# Patient Record
Sex: Female | Born: 1990 | Race: White | Hispanic: No
Health system: Southern US, Community
[De-identification: ages and names within clinical notes are randomized; demographics above are authoritative.]

## PROBLEM LIST (undated history)

## (undated) DIAGNOSIS — F419 Anxiety disorder, unspecified: Secondary | ICD-10-CM

## (undated) DIAGNOSIS — F329 Major depressive disorder, single episode, unspecified: Secondary | ICD-10-CM

## (undated) DIAGNOSIS — R51 Headache: Secondary | ICD-10-CM

## (undated) DIAGNOSIS — F1910 Other psychoactive substance abuse, uncomplicated: Secondary | ICD-10-CM

## (undated) DIAGNOSIS — F32A Depression, unspecified: Secondary | ICD-10-CM

## (undated) HISTORY — DX: Depression, unspecified: F32.A

## (undated) HISTORY — DX: Other psychoactive substance abuse, uncomplicated: F19.10

## (undated) HISTORY — DX: Anxiety disorder, unspecified: F41.9

## (undated) HISTORY — DX: Major depressive disorder, single episode, unspecified: F32.9

## (undated) HISTORY — DX: Headache: R51

---
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
## 2011-09-28 ENCOUNTER — Emergency Department (HOSPITAL_COMMUNITY)
Admission: EM | Admit: 2011-09-28 | Discharge: 2011-09-29 | Disposition: A | Discharge status: Other Acute Inpatient Hospital | Payer: 59 | Attending: Emergency Medicine | Admitting: Emergency Medicine

## 2011-09-28 DIAGNOSIS — Z79899 Other long term (current) drug therapy: Secondary | ICD-10-CM | POA: Insufficient documentation

## 2011-09-28 DIAGNOSIS — M2550 Pain in unspecified joint: Secondary | ICD-10-CM | POA: Insufficient documentation

## 2011-09-28 DIAGNOSIS — IMO0001 Reserved for inherently not codable concepts without codable children: Secondary | ICD-10-CM | POA: Insufficient documentation

## 2011-09-28 DIAGNOSIS — R109 Unspecified abdominal pain: Secondary | ICD-10-CM | POA: Insufficient documentation

## 2011-09-28 DIAGNOSIS — X58XXXA Exposure to other specified factors, initial encounter: Secondary | ICD-10-CM | POA: Insufficient documentation

## 2011-09-28 DIAGNOSIS — S51809A Unspecified open wound of unspecified forearm, initial encounter: Secondary | ICD-10-CM | POA: Insufficient documentation

## 2011-09-28 DIAGNOSIS — R110 Nausea: Secondary | ICD-10-CM | POA: Insufficient documentation

## 2011-09-28 DIAGNOSIS — F1110 Opioid abuse, uncomplicated: Secondary | ICD-10-CM | POA: Insufficient documentation

## 2011-09-28 DIAGNOSIS — R197 Diarrhea, unspecified: Secondary | ICD-10-CM | POA: Insufficient documentation

## 2011-09-28 LAB — CBC
MCH: 32.1 pg (ref 26.0–34.0)
MCHC: 34.1 g/dL (ref 30.0–36.0)
MCV: 94.1 fL (ref 78.0–100.0)
Platelets: 426 10*3/uL — ABNORMAL HIGH (ref 150–400)
RBC: 4.61 MIL/uL (ref 3.87–5.11)

## 2011-09-28 LAB — BASIC METABOLIC PANEL
BUN: 15 mg/dL (ref 6–23)
GFR calc non Af Amer: >90 mL/min (ref >90)
Glucose, Bld: 96 mg/dL (ref 70–99)
Potassium: 3.8 mEq/L (ref 3.5–5.1)

## 2011-09-28 LAB — DIFFERENTIAL
Eosinophils Absolute: 0 10*3/uL (ref 0.0–0.7)
Eosinophils Relative: 0 % (ref 0–5)
Lymphs Abs: 2.8 10*3/uL (ref 0.7–4.0)
Monocytes Absolute: 0.9 10*3/uL (ref 0.1–1.0)
Monocytes Relative: 9 % (ref 3–12)
Neutrophils Relative %: 64 % (ref 43–77)

## 2011-09-29 ENCOUNTER — Inpatient Hospital Stay (HOSPITAL_COMMUNITY)
Admission: RE | Admit: 2011-09-29 | Discharge: 2011-10-03 | DRG: 897 | Disposition: A | Discharge status: Home / Self Care | Payer: 59 | Source: Ambulatory Visit | Attending: Psychiatry | Admitting: Psychiatry

## 2011-09-29 DIAGNOSIS — F1120 Opioid dependence, uncomplicated: Principal | ICD-10-CM

## 2011-09-29 DIAGNOSIS — X838XXA Intentional self-harm by other specified means, initial encounter: Secondary | ICD-10-CM

## 2011-09-29 DIAGNOSIS — G8929 Other chronic pain: Secondary | ICD-10-CM

## 2011-09-29 DIAGNOSIS — F1910 Other psychoactive substance abuse, uncomplicated: Secondary | ICD-10-CM

## 2011-09-29 DIAGNOSIS — Z6379 Other stressful life events affecting family and household: Secondary | ICD-10-CM

## 2011-09-29 DIAGNOSIS — S51809A Unspecified open wound of unspecified forearm, initial encounter: Secondary | ICD-10-CM

## 2011-09-29 DIAGNOSIS — F1520 Other stimulant dependence, uncomplicated: Secondary | ICD-10-CM

## 2011-09-29 DIAGNOSIS — Z560 Unemployment, unspecified: Secondary | ICD-10-CM

## 2011-09-29 DIAGNOSIS — F39 Unspecified mood [affective] disorder: Secondary | ICD-10-CM

## 2011-09-29 LAB — RAPID URINE DRUG SCREEN, HOSP PERFORMED
Amphetamines: NOT DETECTED
Barbiturates: NOT DETECTED
Benzodiazepines: POSITIVE — AB

## 2011-10-05 ENCOUNTER — Other Ambulatory Visit (HOSPITAL_COMMUNITY): Payer: 59 | Attending: Physician Assistant

## 2011-10-05 DIAGNOSIS — F1910 Other psychoactive substance abuse, uncomplicated: Secondary | ICD-10-CM | POA: Insufficient documentation

## 2011-10-05 DIAGNOSIS — F39 Unspecified mood [affective] disorder: Secondary | ICD-10-CM | POA: Insufficient documentation

## 2011-10-05 DIAGNOSIS — F1120 Opioid dependence, uncomplicated: Secondary | ICD-10-CM | POA: Insufficient documentation
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
## 2011-10-07 ENCOUNTER — Other Ambulatory Visit (HOSPITAL_COMMUNITY): Payer: 59 | Attending: Physician Assistant

## 2011-10-07 DIAGNOSIS — F39 Unspecified mood [affective] disorder: Secondary | ICD-10-CM | POA: Insufficient documentation

## 2011-10-07 DIAGNOSIS — F1120 Opioid dependence, uncomplicated: Secondary | ICD-10-CM | POA: Insufficient documentation

## 2011-10-07 DIAGNOSIS — F1910 Other psychoactive substance abuse, uncomplicated: Secondary | ICD-10-CM | POA: Insufficient documentation

## 2011-10-10 ENCOUNTER — Encounter (HOSPITAL_COMMUNITY): Payer: Self-pay | Admitting: *Deleted

## 2011-10-10 ENCOUNTER — Other Ambulatory Visit (HOSPITAL_COMMUNITY): Payer: 59 | Admitting: Psychology
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
## 2011-10-12 ENCOUNTER — Encounter (HOSPITAL_COMMUNITY): Payer: Self-pay | Admitting: Psychology

## 2011-10-12 ENCOUNTER — Other Ambulatory Visit (HOSPITAL_COMMUNITY): Payer: 59 | Admitting: Psychology

## 2011-10-13 ENCOUNTER — Other Ambulatory Visit (HOSPITAL_COMMUNITY): Payer: Self-pay | Admitting: *Deleted

## 2011-10-13 DIAGNOSIS — F1920 Other psychoactive substance dependence, uncomplicated: Secondary | ICD-10-CM

## 2011-10-13 NOTE — Progress Notes (Signed)
  The patient did not phone me or call to explain her absence today in the CD-IOP. When I noted her absence and asked the group about her, one member stated she had sent him a text him informing him that she would not be in group today. She had asked that he tell me she wouldn't be in group. The patient has been instructed to contact myself or HB directly to communicate and issues or problems she may be having. This absence will not be excused and I will speak with her when she is in group next to discuss this.

## 2011-10-14 ENCOUNTER — Other Ambulatory Visit (HOSPITAL_COMMUNITY): Payer: 59

## 2011-10-17 ENCOUNTER — Other Ambulatory Visit (HOSPITAL_COMMUNITY): Payer: 59

## 2011-10-17 ENCOUNTER — Telehealth (HOSPITAL_COMMUNITY): Payer: Self-pay | Admitting: *Deleted

## 2011-10-19 ENCOUNTER — Other Ambulatory Visit (HOSPITAL_COMMUNITY): Payer: 59 | Admitting: Psychology

## 2011-10-20 NOTE — Progress Notes (Signed)
    Daily Group Progress Note  Program: CD-IOP   Group Time: 1-2:30 pm  Participation Level: None  Behavioral Response: Drowsy, nodding off  Type of Therapy: Psycho-education Group  Topic:  Post Acute Withdrawal Symptoms. A presentation and handout was provided on PAWS. These are the very common and frequently experienced symptoms in early recovery. They include memory loss, irritability, insomnia, clumsiness and a number of other impairments that are typically experienced in early recovery. The importance of being patient with one's self and realizing that there is nothing wrong is very critical. Members shared their own particular experiences with PAWS and there was not one group member who had not experienced at least 1 of these symptoms.       Group Time: 2:45-4 pm  Participation Level: None  Behavioral Response: patient did not engage in the discussion or share about her current circumstances. she kept her head down and was writing on her paper  Type of Therapy: Process Group  Topic: Group process and graduation. The second half of group was spent in process. Members shared feelings about current struggles and issues they are facing in early recovery. As the session neared the end, a graduation ceremony was held for a member who was successfully completing the program.    Summary: The patient did not engage in the session today. When asked whether she had attended an AA meeting yesterday as she had been instructed, she reported she had not gone to any meetings. She has continued to avoid eye contact and was observed "nodding off" on numerous occasions throughout the session. She looks down most of the time and appears to be taking notes. There is not interaction between herself and other group members and her reasons for being in the program are unclear.    Family Program: Family present? No   Name of family member(s):   UDS collected: No Results:  AA/NA attended?: No  Sponsor?: No   Vamsi Apfel, LCAS

## 2011-10-21 ENCOUNTER — Other Ambulatory Visit (HOSPITAL_COMMUNITY): Payer: 59 | Admitting: Psychology

## 2011-10-24 ENCOUNTER — Other Ambulatory Visit (HOSPITAL_COMMUNITY): Payer: 59 | Admitting: Psychology

## 2011-10-24 NOTE — Progress Notes (Signed)
    Daily Group Progress Note  Program: CD-IOP   Group Time: 1-2:30 pm  Participation Level: Minimal  Behavioral Response: Appropriate  Type of Therapy: Activity Group  Topic: Gentle Yoga: A certified yoga instructor visited the group this afternoon. She led the group om yoga stretches and poses. The group responded well and concurred that they felt relaxed and more open at the conclusion of the session. The purpose of this session was to demonstrate the way one can change their feelings quickly and in a healthy manner. There was good feedback and the group responded well to this intervention.       Group Time: 2:45-4 pm  Participation Level: None  Behavioral Response: Patient only spoke when I addressed or questioned her.  Type of Therapy: Process Group  Topic: Process Group; second half of session spent in process. Members shared their feelings about current issues in early recovery. They discussed their plans for the upcoming weekend and the activities that would support their efforts at abstinence.    Summary: The patient engaged actively in the yoga class and appeared much brighter and more awake than she has in previous sessions. [name] did not offer any commentary about herself or her life and I pointed out that she needed to begin sharing in group. When asked about attending AA, the patient admitted she had not been to any meetings. She explained that she was scared. Another group member said she would meet her at a meeting and they could go together. I encouraged them to speak more about this at the end of the group. This patient has been quiet since she first came to the program and it is unclear what she intends to accomplish by sitting and listening for the next 6-7 weeks. Will speak with her again about opening up in group.   Family Program: Family present? No   Name of family member(s):   UDS collected: No Results:  AA/NA attended?: No}  Sponsor?:  No   [name], LCAS

## 2011-10-25 NOTE — Progress Notes (Signed)
    Daily Group Progress Note  Program: CD-IOP   Group Time: 1-2:30 pm  Participation Level: None  Behavioral Response: patient does not talk in the session.  Type of Therapy: Psycho-education Group  Topic: Values: A presentation was made on Values and the way one's addiction "hijacks" one's values. Group members were provided with a handout listing many different values. They were asked to identify their top 3. Members then shared some of the values they had identified. A discussion ensued about the ways in which members abandoned their values while in their active addiction. There was good disclosure and members were very candid about the things they had done in their active addiction that conflicted entirely with what they value.      Group Time: 2:45-4pm  Participation Level: Minimal  Behavioral Response: Rationalizing  Type of Therapy: Process Group  Topic: Process Group; second half of session spent in process. Group members shared feelings in early recovery. Some complained about emotional lability while another expressed frustration over lack of sleep. One member brought his S/O and she shared about how his drinking had impacted her.    Summary: the patient said little during discussion on Values. When asked about attending AA over the weekend, she shook her head and admitted she had not gone to any meetings. She reported her mother was going to go with her to a meeting tonight. The patient's fellow group members encouraged her to attend a meeting and described their hesitation upon their first meeting. The patient stated her intention to go to a meeting tonight.    Family Program: Family present? No   Name of family member(s):   UDS collected: No Results:   AA/NA attended?: No  Sponsor?: No   Connar Keating, LCAS

## 2011-10-26 ENCOUNTER — Encounter (HOSPITAL_COMMUNITY): Payer: Self-pay | Admitting: *Deleted

## 2011-10-26 ENCOUNTER — Other Ambulatory Visit (HOSPITAL_COMMUNITY): Payer: 59 | Admitting: Psychology

## 2011-10-26 NOTE — Progress Notes (Addendum)
   THERAPIST PROGRESS NOTE Session Time: 2:00 pm  Participation Level: Active  Behavioral Response: Fairly GroomedAlertDepressed  Type of Therapy: Individual Therapy  Treatment Goals addressed: Anxiety, Communication: pt encouraged to participate more during group time and communicate with group members, Coping and Diagnosis: worked with pt on accepting her chemical dependence diagnosis and beginning to form relapse prevention plan  Interventions: CBT, Solution Focused, Strength-based, Conservator, museum/gallery, Supportive and Social Skills Training  Summary: [name] is a 20 y.o. female who presents with a flat affect and very reserved manner. She admits to isolating a lot and being very shy in the group setting. She has not attended any AA/NA meetings since starting the CDIOP group. Challenged her to do so and reminded her that she must in order to stay in the program. Pt discussed her daughter [name]) who lives with pts grandmother in [place]. Pt reports missing her but wanting her to stay in [place] because she is happy in school and she is well provided for. Pt had a difficult time naming negative consequences of her addiction, but when challenged she was able to name withdrawing from family and friends, losing relationships which were important to her and reduced academic performance. She has limited insight and is very emotionally and socially immature but she appears invested in staying sober. She reported that she "cannot go back to living like that" and explained it was very tiring always using or wanting to use.  Suicidal/Homicidal: Nowithout intent/plan  Therapist Response: Challenged pt to begin attending 12-step groups 4 times/week and helped her identify groups that would be a good fit for her. Challenged pt to begin to have a routine in her days and change some of her behaviors to begin to feel better (excercise, eating better, not isolating so much). Validated pts introversion  but encouraged her to begin to share in group and participate. Identified strengths for pt that she has shown- resilience, determination to get clean, and high level of care for others. Completed Treatment Plan with pt and sent it in for batch scan.  Plan: Return again in 2 weeks.  Diagnosis: Axis I: Opiate dependence (304.00)    Axis II: No diagnosis    Aris Lot, COUNS 10/26/2011

## 2011-10-28 ENCOUNTER — Other Ambulatory Visit (HOSPITAL_COMMUNITY): Payer: 59

## 2011-10-31 ENCOUNTER — Other Ambulatory Visit (HOSPITAL_COMMUNITY): Payer: 59 | Admitting: Psychology

## 2011-10-31 NOTE — Progress Notes (Signed)
    Daily Group Progress Note  Program: CD-IOP   Group Time: 1-2:30 pm  Participation Level: none  Behavioral Response: patient did not share during this half of group  Type of Therapy: Psycho-education Group  Topic: Communication: A presentation was provided on different types of communication styles. A handout was provided with examples of assertive, passive, aggressive and passive-aggressive communication styles. A scenario was read and members acted out 3 different role plays demonstrating the different ways they could communicate. There was good discussion and members identified their typical communication styles, most of which were passive and passive-aggressive. There was good discussion with examples provided and the group reflecting back on their perspectives and experiences.      Group Time: 2:45-4pm  Participation Level: Minimal  Behavioral Response: Sharing  Type of Therapy: Process Group  Topic: Process group: second half of group was spent in process. Members discussed their feelings and frustrations in early recovery. There was also discussion about plans for the upcoming holiday and preparations that will support ongoing recovery.    Summary: The patient was quiet for much of the group. In the process session, she reported she has a friend who wants her to come down and attend a meeting with her. That, or get high with her. The group expressed concerns about their fellow group member and encouraged her to focus on herself. One member reminded her that she has to be selfish before she can be selfless. She admitted that traveling to [place], where she grew up and used a lot of drugs, makes her nervous, but she was planning on spending the holiday with her grandmother and daughter. She reported she had attended an NA meeting last night here in [place]. It had been a speaker meeting and there had been a lot of people. She assured the group she would attend a meeting over  the weekend.    Family Program: Family present? No   Name of family member(s):   UDS collected: No Results:   AA/NA attended?: [place]  Sponsor?: No   Denelda Akerley, LCAS

## 2011-11-01 NOTE — Progress Notes (Signed)
    Daily Group Progress Note  Program: CD-IOP   Group Time: 1-2:30 pm  Participation Level: Minimal  Behavioral Response: Sharing  Type of Therapy: Process Group  Topic: Group Process; first half of group was spent in process. Members shared about their holiday weekends and the numerous ways they spent their long holiday weekend. Three members admitted using over the weekend and their "slips" were processed and discussed at length. The contrast in motivation and intention was quite apparent among the group members and while some members attended many meetings over the weekend, others did not go to any meetings or seek out support for their recovery.      Group Time: 2:45-4 pm  Participation Level: None  Behavioral Response: patient did not share or comment during film  Type of Therapy: Psycho-education Group  Topic: The Neurobiology of Addiction, Part 1. The second half of group was spent watching the DVD, "The Neurobiology of Addiction". It presented a very informative presentation on the brain chemistry of addiction and the chemicals that chemically dependent people are missing or experience reduced levels of. The film was halted numerous times during the presentation and the concepts discussed thoroughly to insure that group members clearly understood the chemical nature of their addiction   Summary: The patient admitted during the check-in that her sobriety date had changed. She admitted she had shot heroin over the weekend and had even done some powder around 6 am this morning. When asked, she denied that she was under the influence. The patient reported that she had gone to [place] for Thanksgiving, but on Friday her mother stayed there while she returned to [place]. She admitted that she is now shooting heroin herself - last time she had informed the group that someone had injected the drug for her. The patient stated her intention to attend a meeting today and another member  encouraged her to come with them to an AA meeting. The patient displayed poor insight and couldn't say what she was thinking or intended to do going forward.   Family Program: Family present? No   Name of family member(s):   UDS collected: No Results:   AA/NA attended?: No  Sponsor?: No   Laneisha Mino, LCAS

## 2011-11-02 ENCOUNTER — Other Ambulatory Visit (HOSPITAL_COMMUNITY): Payer: 59 | Admitting: Psychology

## 2011-11-02 ENCOUNTER — Encounter (HOSPITAL_COMMUNITY): Payer: Self-pay | Admitting: Psychology

## 2011-11-02 DIAGNOSIS — F1920 Other psychoactive substance dependence, uncomplicated: Secondary | ICD-10-CM

## 2011-11-03 ENCOUNTER — Telehealth (HOSPITAL_COMMUNITY): Payer: Self-pay | Admitting: *Deleted

## 2011-11-03 ENCOUNTER — Encounter (HOSPITAL_COMMUNITY): Payer: Self-pay | Admitting: Psychology

## 2011-11-03 NOTE — Progress Notes (Signed)
    Daily Group Progress Note  Program: CD-IOP   Group Time: 1-2:30 pm  Participation Level: Minimal  Behavioral Response: patient made no comments during this half of group  Type of Therapy: Psycho-education Group  Topic: Pharmacist: The first half of group was spent with the [facility] Pharmacist, Peggye Fothergill. She engaged the group in a discussion on different medications for various illnesses and the specific issues and complications most typical in the chemically dependent population. Group members asked many questions, especially as relates to mood disorders and sleeplessness. There was good interaction and exchange and the session went well.      Group Time: 2:45-4 pm  Participation Level: Active  Behavioral Response: Sharing, passive, appears helpless  Type of Therapy: Process Group  Topic:Group Process and Graduation: second half of group spent in process. There were 2 new group members present and they shared about their lives in addiction and what they are wanting from the group. Near the conclusion of the session, a graduation ceremony was held for a graduating member.     Summary: The patient was very quiet during this half of group. I sought her out and asked about her recovery? She admitted she has continued to use heroin on a daily basis. She has learned to inject herself and also noted she is "shooting" cocaine, but not with the heroin. When asked about whether she is feeling ill and suffering withdrawal, she admitted she doesn't feel good in the mornings and has purchased Soboxone on the street at least once to help with the withdrawal. Other group members expressed concerns about her and encouraged her to consider doing something more to address her addiction. The patient reminded them that the people she met upstairs in detox are the people she has associated with since discharge and they have taught her to use heroin. Other members explained that she could go somewhere for  28 days and get some sobriety, which she doesn't appear to be doing in this program. The patient agreed that it was a problem, but shrugged her shoulders and appeared almost unable to doing anything about it. The patient admitted she had not gone to any meetings, but agreed it would help if she would go. Another member invited her to join her at a 6 pm meeting. The patient continues to use and she will be recommended to consider a residential referral because of her inability to stop using.    Family Program: Family present? No   Name of family member(s):   UDS collected: Yes Results:   AA/NA attended?: No  Sponsor?: No   Faustino Luecke, LCAS

## 2011-11-04 ENCOUNTER — Other Ambulatory Visit (HOSPITAL_COMMUNITY): Payer: 59 | Admitting: *Deleted

## 2011-11-04 NOTE — Progress Notes (Signed)
Patient ID: [name], female   DOB: 01/22/1991, 20 y.o.   MRN: [id number] I spoke with the patient at the conclusion of her CD-IOP group session. The patient had disclosed in group that she has continued to use heroin daily and has felt very badly in the mornings because of withdrawal. She admitted she had even purchased suboxone to feel better and ward off the withdrawal symptoms. I explained that it seemed clear that she could not attain any degree of sobriety and stability on her own and encouraged her to consider inpatient treatment. The patient was very passive and uncertain about her intentions. She seems unable to articulate what she needs or is even willing to do and expressed that she was "scared" to go into residential treatment. I validated her fear of the unknown, but pointed out that her fellow group members spoke highly about their own experiences in residential treatment and how helpful the experience had proven for their recovery. I stated that I would discuss her case with the treatment team tomorrow morning and encouraged her to consider this recommendation. I assured the patient that I would speak with her before I contacted her mother, but that we would very likely stick to a recommendation for detox and residential treatment to insure her safety and well-being. The patient agreed to speak with me tomorrow.

## 2011-11-04 NOTE — Progress Notes (Signed)
Patient ID: [name], female   DOB: 07/22/1991, 20 y.o.   MRN: [id number] Discussed this patient's condition in treatment team meeting and the agreement was that the patient should be referred to residential treatment. I phoned the patient to discuss this with her after she had spoken with her primary counselor, HB. I encouraged her to contact [facility] and speak with them about what is going on with her and what they are able to offer her. I reminded the patient that if she was not pleased with her treatment she was always free to leave. I explained that she had developed a "habit' and most opiate dependent people were unsuccessful in "freeing" themselves from opiate dependence without outside help. The patient agreed with me that if she continues it is likely to get much worse. I asked her to consider inpatient in order to attain total sobriety and that she could return to the CD-IOP upon discharge. She stated she would contact the facility with the number I had provided her.

## 2011-11-07 ENCOUNTER — Other Ambulatory Visit (HOSPITAL_COMMUNITY): Payer: 59 | Attending: Physician Assistant

## 2011-11-07 NOTE — Progress Notes (Signed)
Patient ID: [name], female   DOB: Aug 23, 1991, 20 y.o.   MRN: [id number] Discharge Note: Pt is being discharged from the CDIOP program for noncompliance. She reports that she has been using IV heroin and cocaine daily for the past two weeks. She refuses to go to residential treatment at [facility] and has been told that IOP is no longer the right level of care for her. Her prognosis is poor unless she decides to pursue care for her addiction. She has attended one AA/NA meeting in while in our program and has refused any other outside 12-step work as well. Her mother has been contacted regarding the risk that we feel pt is at for overdose. Pt was also informed that she is welcome to return to our IOP program once she completes a higher level of care. Every attempt was made to help patient see her need for residential treatment and provide her with the means/process to receive it.

## 2011-11-09 ENCOUNTER — Other Ambulatory Visit (HOSPITAL_COMMUNITY): Payer: 59

## 2011-11-11 ENCOUNTER — Other Ambulatory Visit (HOSPITAL_COMMUNITY): Payer: 59

## 2011-11-14 ENCOUNTER — Other Ambulatory Visit (HOSPITAL_COMMUNITY): Payer: 59

## 2011-11-16 ENCOUNTER — Other Ambulatory Visit (HOSPITAL_COMMUNITY): Payer: 59

## 2011-11-18 ENCOUNTER — Other Ambulatory Visit (HOSPITAL_COMMUNITY): Payer: 59

## 2011-11-21 ENCOUNTER — Other Ambulatory Visit (HOSPITAL_COMMUNITY): Payer: 59

## 2011-11-23 ENCOUNTER — Other Ambulatory Visit (HOSPITAL_COMMUNITY): Payer: 59

## 2011-11-25 ENCOUNTER — Other Ambulatory Visit (HOSPITAL_COMMUNITY): Payer: 59

## 2011-11-28 ENCOUNTER — Other Ambulatory Visit (HOSPITAL_COMMUNITY): Payer: 59

## 2011-11-30 ENCOUNTER — Other Ambulatory Visit (HOSPITAL_COMMUNITY): Payer: 59

## 2011-12-02 ENCOUNTER — Ambulatory Visit (INDEPENDENT_AMBULATORY_CARE_PROVIDER_SITE_OTHER): Payer: 59

## 2011-12-02 ENCOUNTER — Other Ambulatory Visit (HOSPITAL_COMMUNITY): Payer: 59

## 2011-12-02 DIAGNOSIS — Z0000 Encounter for general adult medical examination without abnormal findings: Secondary | ICD-10-CM

## 2011-12-02 DIAGNOSIS — K920 Hematemesis: Secondary | ICD-10-CM

## 2011-12-02 DIAGNOSIS — Z113 Encounter for screening for infections with a predominantly sexual mode of transmission: Secondary | ICD-10-CM

## 2011-12-05 ENCOUNTER — Other Ambulatory Visit (HOSPITAL_COMMUNITY): Payer: 59

## 2011-12-07 ENCOUNTER — Other Ambulatory Visit (HOSPITAL_COMMUNITY): Payer: 59 | Attending: Physician Assistant

## 2011-12-08 ENCOUNTER — Emergency Department (HOSPITAL_COMMUNITY)
Admission: EM | Admit: 2011-12-08 | Discharge: 2011-12-09 | Disposition: A | Discharge status: Other Acute Inpatient Hospital | Payer: 59 | Attending: Emergency Medicine | Admitting: Emergency Medicine

## 2011-12-08 ENCOUNTER — Encounter (HOSPITAL_COMMUNITY): Payer: Self-pay | Admitting: Emergency Medicine

## 2011-12-08 DIAGNOSIS — F1920 Other psychoactive substance dependence, uncomplicated: Secondary | ICD-10-CM

## 2011-12-08 DIAGNOSIS — F341 Dysthymic disorder: Secondary | ICD-10-CM | POA: Insufficient documentation

## 2011-12-08 LAB — CBC
HCT: 42.3 % (ref 36.0–46.0)
Hemoglobin: 13.7 g/dL (ref 12.0–15.0)
MCH: 30 pg (ref 26.0–34.0)
MCV: 92.6 fL (ref 78.0–100.0)
RBC: 4.57 MIL/uL (ref 3.87–5.11)

## 2011-12-08 LAB — DIFFERENTIAL
Eosinophils Absolute: 0.2 10*3/uL (ref 0.0–0.7)
Eosinophils Relative: 2 % (ref 0–5)
Lymphs Abs: 2.5 10*3/uL (ref 0.7–4.0)
Monocytes Relative: 10 % (ref 3–12)

## 2011-12-08 NOTE — ED Notes (Signed)
PT. REQUESTING DETOX FOR HEROIN AND OPIUM ,  LAST INTAKE TODAY ,  DENIES SUICIDAL OR HOMICIDAL IDEATION .

## 2011-12-09 ENCOUNTER — Encounter (HOSPITAL_COMMUNITY): Payer: Self-pay | Admitting: *Deleted

## 2011-12-09 ENCOUNTER — Inpatient Hospital Stay (HOSPITAL_COMMUNITY)
Admission: AD | Admit: 2011-12-09 | Discharge: 2011-12-13 | DRG: 897 | Disposition: A | Discharge status: Home / Self Care | Payer: 59 | Source: Ambulatory Visit | Attending: Psychiatry | Admitting: Psychiatry

## 2011-12-09 DIAGNOSIS — F411 Generalized anxiety disorder: Secondary | ICD-10-CM

## 2011-12-09 DIAGNOSIS — F1120 Opioid dependence, uncomplicated: Principal | ICD-10-CM

## 2011-12-09 DIAGNOSIS — F1190 Opioid use, unspecified, uncomplicated: Secondary | ICD-10-CM | POA: Diagnosis present

## 2011-12-09 DIAGNOSIS — F3289 Other specified depressive episodes: Secondary | ICD-10-CM

## 2011-12-09 DIAGNOSIS — F329 Major depressive disorder, single episode, unspecified: Secondary | ICD-10-CM

## 2011-12-09 DIAGNOSIS — F17200 Nicotine dependence, unspecified, uncomplicated: Secondary | ICD-10-CM

## 2011-12-09 LAB — BASIC METABOLIC PANEL
CO2: 29 mEq/L (ref 19–32)
Glucose, Bld: 128 mg/dL — ABNORMAL HIGH (ref 70–99)
Potassium: 4.1 mEq/L (ref 3.5–5.1)
Sodium: 136 mEq/L (ref 135–145)

## 2011-12-09 LAB — RAPID URINE DRUG SCREEN, HOSP PERFORMED: Benzodiazepines: NOT DETECTED

## 2011-12-09 LAB — ETHANOL: Alcohol, Ethyl (B): <11 mg/dL (ref 0–11)

## 2011-12-09 MED ORDER — ALUM & MAG HYDROXIDE-SIMETH 200-200-20 MG/5ML PO SUSP: 30.0000 mL | ORAL | Status: DC | PRN

## 2011-12-09 MED ORDER — HYDROXYZINE HCL 25 MG PO TABS
25.0000 mg | ORAL_TABLET | Freq: Four times a day (QID) | ORAL | Status: DC | PRN
  Administered 2011-12-09 – 2011-12-13 (×9): 25 mg via ORAL

## 2011-12-09 MED ORDER — MAGNESIUM HYDROXIDE 400 MG/5ML PO SUSP: 30.0000 mL | Freq: Every day | ORAL | Status: DC | PRN

## 2011-12-09 MED ORDER — ONDANSETRON 4 MG PO TBDP: 4.0000 mg | ORAL_TABLET | Freq: Four times a day (QID) | ORAL | Status: DC | PRN

## 2011-12-09 MED ORDER — METHOCARBAMOL 500 MG PO TABS
500.0000 mg | ORAL_TABLET | Freq: Three times a day (TID) | ORAL | Status: DC | PRN
  Administered 2011-12-09 – 2011-12-10 (×2): 500 mg via ORAL
  Filled 2011-12-09 (×2): qty 1

## 2011-12-09 MED ORDER — CLONIDINE HCL 0.1 MG PO TABS
0.1000 mg | ORAL_TABLET | ORAL | Status: DC
  Filled 2011-12-09 (×3): qty 1

## 2011-12-09 MED ORDER — ACETAMINOPHEN 325 MG PO TABS: 650.0000 mg | ORAL_TABLET | ORAL | Status: DC | PRN

## 2011-12-09 MED ORDER — NAPROXEN 500 MG PO TABS
500.0000 mg | ORAL_TABLET | Freq: Two times a day (BID) | ORAL | Status: DC | PRN
  Administered 2011-12-09 – 2011-12-13 (×3): 500 mg via ORAL
  Filled 2011-12-09 (×3): qty 1

## 2011-12-09 MED ORDER — CLONIDINE HCL 0.1 MG PO TABS: 0.1000 mg | ORAL_TABLET | Freq: Every day | ORAL | Status: DC

## 2011-12-09 MED ORDER — LOPERAMIDE HCL 2 MG PO CAPS: 2.0000 mg | ORAL_CAPSULE | ORAL | Status: DC | PRN

## 2011-12-09 MED ORDER — DICYCLOMINE HCL 20 MG PO TABS
20.0000 mg | ORAL_TABLET | ORAL | Status: DC | PRN
  Administered 2011-12-10: 20 mg via ORAL
  Filled 2011-12-09: qty 1

## 2011-12-09 MED ORDER — CLONIDINE HCL 0.1 MG PO TABS
0.1000 mg | ORAL_TABLET | Freq: Four times a day (QID) | ORAL | Status: AC
  Administered 2011-12-09 – 2011-12-11 (×7): 0.1 mg via ORAL
  Filled 2011-12-09 (×8): qty 1

## 2011-12-09 MED ORDER — ONDANSETRON HCL 8 MG PO TABS: 4.0000 mg | ORAL_TABLET | Freq: Three times a day (TID) | ORAL | Status: DC | PRN

## 2011-12-09 MED ORDER — LORAZEPAM 1 MG PO TABS
1.0000 mg | ORAL_TABLET | Freq: Three times a day (TID) | ORAL | Status: DC | PRN
  Administered 2011-12-09: 1 mg via ORAL
  Filled 2011-12-09: qty 1

## 2011-12-09 MED ORDER — NICOTINE 21 MG/24HR TD PT24
21.0000 mg | MEDICATED_PATCH | Freq: Once | TRANSDERMAL | Status: DC
  Administered 2011-12-09: 21 mg via TRANSDERMAL
  Filled 2011-12-09: qty 1

## 2011-12-09 MED ORDER — IBUPROFEN 200 MG PO TABS: 600.0000 mg | ORAL_TABLET | Freq: Three times a day (TID) | ORAL | Status: DC | PRN

## 2011-12-09 MED ORDER — ACETAMINOPHEN 325 MG PO TABS
650.0000 mg | ORAL_TABLET | Freq: Four times a day (QID) | ORAL | Status: DC | PRN
  Administered 2011-12-12: 650 mg via ORAL

## 2011-12-09 NOTE — ED Provider Notes (Signed)
History     CSN: [id number]  Arrival date & time 12/08/11  2252   First MD Initiated Contact with Patient 12/09/11 0258      Chief Complaint  Patient presents with  . Addiction Problem    (Consider location/radiation/quality/duration/timing/severity/associated sxs/prior treatment) Patient is a 21 y.o. female presenting with mental health disorder. The history is provided by the patient. No language interpreter was used.  Mental Health Problem The primary symptoms do not include dysphoric mood, delusions, hallucinations, bizarre behavior, disorganized speech, negative symptoms or somatic symptoms. Primary symptoms comment: heroin and opiate abuse The current episode started more than 1 month ago (3-4 years ago heroin several months ago). This is a chronic problem.  Precipitated by: nothing. The degree of incapacity that she is experiencing as a consequence of her illness is mild. Sequelae: none. Additional symptoms of the illness do not include no unexpected weight change, no increased goal-directed activity, no flight of ideas, no headaches or no abdominal pain. She does not admit to suicidal ideas. She does not have a plan to commit suicide. She does not contemplate harming herself. She has not already injured self. She does not contemplate injuring another person. She has not already  injured another person. Risk factors that are present for mental illness include substance abuse.    Past Medical History  Diagnosis Date  . Anxiety   . Depression   . Headache     History reviewed. No pertinent past surgical history.  Family History  Problem Relation Age of Onset  . Alcohol abuse Father   . Drug abuse Father   . Depression Father   . Anxiety disorder Father     History  Substance Use Topics  . Smoking status: Current Everyday Smoker  . Smokeless tobacco: Not on file  . Alcohol Use: Yes     used very rarely per self-report    OB History    Grav Para Term Preterm Abortions  TAB SAB Ect Mult Living                  Review of Systems  Constitutional: Negative for unexpected weight change.  HENT: Negative for facial swelling.   Eyes: Negative for discharge.  Respiratory: Negative for shortness of breath.   Cardiovascular: Negative for chest pain.  Gastrointestinal: Negative for abdominal pain and abdominal distention.  Genitourinary: Negative for difficulty urinating.  Musculoskeletal: Negative for arthralgias.  Skin: Negative.   Neurological: Negative for headaches.  Hematological: Negative.   Psychiatric/Behavioral: Negative for hallucinations and dysphoric mood.    Allergies  Review of patient's allergies indicates no known allergies.  Home Medications   Current Outpatient Rx  Name Route Sig Dispense Refill  . PRESCRIPTION MEDICATION Oral Take 1 tablet by mouth daily. Birth Control. Ortho-Novum 7-7-7.       BP 106/66  Pulse 83  Temp(Src) 98.1 F (36.7 C) (Oral)  Resp 16  SpO2 99%  LMP 11/19/2011  Physical Exam  Constitutional: She is oriented to person, place, and time. She appears well-developed and well-nourished. No distress.  HENT:  Head: Normocephalic and atraumatic.  Mouth/Throat: Oropharynx is clear and moist.  Eyes: Conjunctivae and EOM are normal.  Neck: Normal range of motion. Neck supple.  Cardiovascular: Normal rate and regular rhythm.   Pulmonary/Chest: Effort normal and breath sounds normal.  Abdominal: Soft. Bowel sounds are normal. There is no tenderness. There is no rebound.  Musculoskeletal: Normal range of motion. She exhibits no edema.  Neurological: She is alert and  oriented to person, place, and time.  Skin: Skin is warm and dry.  Psychiatric: Thought content normal.    ED Course  Procedures (including critical care time)  Labs Reviewed  URINE RAPID DRUG SCREEN (HOSP PERFORMED) - Abnormal; Notable for the following:    Opiates POSITIVE (*)    All other components within normal limits  BASIC METABOLIC  PANEL - Abnormal; Notable for the following:    Glucose, Bld 128 (*)    Creatinine, Ser 0.49 (*)    All other components within normal limits  ETHANOL  CBC  DIFFERENTIAL  POCT PREGNANCY, URINE  POCT PREGNANCY, URINE   No results found.   No diagnosis found.    MDM  ACT notified to see       Peytyn Trine K Ratasha Fabre-Rasch, MD 12/09/11 [id number]

## 2011-12-09 NOTE — ED Notes (Signed)
Patient accepted [facility] @109pm 

## 2011-12-09 NOTE — ED Notes (Signed)
MD at bedside. 

## 2011-12-09 NOTE — ED Notes (Signed)
Pt requesting something to help her with he nerves.

## 2011-12-09 NOTE — Progress Notes (Signed)
Writer introduced self to patient as her nurse for the night. Writer inquired as to how she was feeling and pt reported that she did not feel well and described her symptoms while in group as sitting in group but felt very restless and aching in her arms and legs. Writer gave pt prn medications for the aching and informed her that it was too soon for anther dose of visteril. Pt was informed of the next dose due for visteril  and pt was agreeable to waiting until then. Pt denies si/hi/a/v hall. Safety maintained on unit will continue to monitor.

## 2011-12-09 NOTE — Progress Notes (Signed)
Patient ID: [name], female   DOB: Jul 30, 1991, 20 y.o.   MRN: [id number] Pt is 21 year old who has been living in her car after arguments with mother led to her being kicked out of the house.  She has a 10 year old daughter who is living with her grandparents who she sees "not much".  Pt has been using heroin and opiates.  Started using heroin the day after she left [facility].  Says she has been using pain pills for years.  She denies SI or HI, but says sometimes she thinks about suicide but has never formulated a plan.  Says her father had a suicide attempt and is still alive.  She says her goals are to be clean and to go to long term treatment like [facility].  Pt has no medical problems and NKA.the patient is requesting to sign a 72 hour notice as she would like to get long term treatment right away.

## 2011-12-09 NOTE — ED Notes (Signed)
Sat and talked with pt for awhile and pt wants to get off Heroias her boyfriend recently has.

## 2011-12-09 NOTE — ED Notes (Signed)
Patient is resting comfortably. 

## 2011-12-09 NOTE — Progress Notes (Signed)
Pt. Cooperative but anxious.  PRN given for anxiety. Pt. Signed a 72 hour request for discharge at 1530. Support given.

## 2011-12-09 NOTE — BH Assessment (Addendum)
Assessment Note   [name] is an 21 y.o. femalethat presented to the ED requesting long-term treatment for her Heroin Dependence.  Pt admits "shooting up" over 1 gram QD for 2 mths, with prior use of "painpills" for over five years.  Pt was treated at [facility] for detox in October for same, but admits not remaining clean or following up outpatient upon discharge.  Pt reports sweating and fever/chills as her current withdrawals.  Pt denies any other substance use.  Pt denies any SI, HI, or active psychotic features at this time.  Pt is able to contract for safety and would like to be transferred to [facility] if possible, for longer term care.  Writer contacted [employer] at [facility], who requested documentation supporting admission.  All paperwork faxed to [phone].  Per Coy Saunas, she will run screening material by the doctor for possible admission.  Awaiting disposition.   Patient accepted [facility], awaiting transportation.  Axis I: Opioid Dependence Axis II: Deferred Axis III:  Past Medical History  Diagnosis Date  . Anxiety   . Depression   . Headache    Axis IV: other psychosocial or environmental problems, problems related to legal system/crime and problems related to social environment Axis V: 41-50 serious symptoms  Past Medical History:  Past Medical History  Diagnosis Date  . Anxiety   . Depression   . Headache     History reviewed. No pertinent past surgical history.  Family History:  Family History  Problem Relation Age of Onset  . Alcohol abuse Father   . Drug abuse Father   . Depression Father   . Anxiety disorder Father     Social History:  reports that she has been smoking.  She does not have any smokeless tobacco history on file. She reports that she drinks alcohol. She reports that she uses illicit drugs (Oxycodone, Methamphetamines, Cocaine, Heroin, and Other-see comments).  Additional Social History:  Alcohol / Drug Use Pain Medications:  0 Prescriptions: 0 Over the Counter: 0 History of alcohol / drug use?: Yes Longest period of sobriety (when/how long): week Negative Consequences of Use: Financial;Legal;Personal relationships;Work / Programmer, multimedia Withdrawal Symptoms: Fever / Chills;Weakness Substance #1 Name of Substance 1: Heroin 1 - Age of First Use: 20 1 - Amount (size/oz): over 1 gram 1 - Duration: QD 1 - Last Use / Amount: last night Substance #2 Name of Substance 2: Opiates 2 - Age of First Use: 15 2 - Amount (size/oz): handfuls 2 - Frequency: rarely now 2 - Duration: years 2 - Last Use / Amount: last week Allergies: No Known Allergies  Home Medications:  Medications Prior to Admission  Medication Dose Route Frequency Provider Last Rate Last Dose  . acetaminophen (TYLENOL) tablet 650 mg  650 mg Oral Q4H PRN April K Palumbo-Rasch, MD      . ibuprofen (ADVIL,MOTRIN) tablet 600 mg  600 mg Oral Q8H PRN April K Palumbo-Rasch, MD      . LORazepam (ATIVAN) tablet 1 mg  1 mg Oral Q8H PRN April K Palumbo-Rasch, MD      . ondansetron [facility]) tablet 4 mg  4 mg Oral Q8H PRN April K Palumbo-Rasch, MD       No current outpatient prescriptions on file as of 12/08/2011.    OB/GYN Status:  Patient's last menstrual period was 11/19/2011.  General Assessment Data Location of Assessment: [facility] ED Living Arrangements: Parent Can pt return to current living arrangement?: Yes Admission Status: Voluntary Is patient capable of signing voluntary  admission?: Yes Transfer from: Acute Hospital Referral Source: MD  Education Status Is patient currently in school?: No  Risk to self Suicidal Ideation: No Suicidal Intent: No Is patient at risk for suicide?: No Suicidal Plan?: No Access to Means: No What has been your use of drugs/alcohol within the last 12 months?: Heroin x 2 mths Previous Attempts/Gestures: No How many times?: 0  Other Self Harm Risks: impulsive and reckless Intentional Self Injurious Behavior: Damaging Comment  - Self Injurious Behavior: shooting up Heroin and stealing to get it Family Suicide History: No Recent stressful life event(s): Legal Issues Persecutory voices/beliefs?: No Depression: Yes Depression Symptoms: Feeling worthless/self pity Substance abuse history and/or treatment for substance abuse?: Yes Suicide prevention information given to non-admitted patients: Not applicable  Risk to Others Homicidal Ideation: No Thoughts of Harm to Others: No Current Homicidal Intent: No Current Homicidal Plan: No Access to Homicidal Means: No History of harm to others?: No Assessment of Violence: None Noted Does patient have access to weapons?: Yes (Comment) Criminal Charges Pending?: Yes Describe Pending Criminal Charges: possible Larceny warrant Does patient have a court date: No  Psychosis Hallucinations: None noted Delusions: None noted  Mental Status Report Eye Contact: Good Motor Activity: Unremarkable Speech: Logical/coherent Level of Consciousness: Quiet/awake Mood: Ambivalent Affect: Appropriate to circumstance Anxiety Level: Minimal Thought Processes: Relevant Judgement: Impaired Orientation: Person;Place;Time;Situation Obsessive Compulsive Thoughts/Behaviors: Moderate  Cognitive Functioning Memory: Recent Impaired IQ: Average Insight: Poor Impulse Control: Poor Appetite: Poor Weight Loss: 0  Weight Gain: 0  Sleep: No Change Total Hours of Sleep: 4  Vegetative Symptoms: None  Prior Inpatient Therapy Prior Inpatient Therapy: Yes Prior Therapy Dates: 09/2011 Prior Therapy Facilty/Provider(s): [facility] Reason for Treatment: detox  Prior Outpatient Therapy Prior Outpatient Therapy: No Prior Therapy Dates: n/a Prior Therapy Facilty/Provider(s): n/a Reason for Treatment: none                     Additional Information 1:1 In Past 12 Months?: No CIRT Risk: No Elopement Risk: No Does patient have medical clearance?: Yes     Disposition:    Disposition Disposition of Patient: Referred to  On Site Evaluation by:   Reviewed with Physician:     Angelica Ran 12/09/2011 9:00 AM

## 2011-12-10 DIAGNOSIS — F1110 Opioid abuse, uncomplicated: Secondary | ICD-10-CM

## 2011-12-10 DIAGNOSIS — F1190 Opioid use, unspecified, uncomplicated: Secondary | ICD-10-CM | POA: Diagnosis present

## 2011-12-10 MED ORDER — NICOTINE 21 MG/24HR TD PT24
MEDICATED_PATCH | TRANSDERMAL | Status: AC
  Filled 2011-12-10: qty 1

## 2011-12-10 MED ORDER — NICOTINE 21 MG/24HR TD PT24
21.0000 mg | MEDICATED_PATCH | Freq: Every day | TRANSDERMAL | Status: DC
  Administered 2011-12-10 – 2011-12-13 (×4): 21 mg via TRANSDERMAL
  Filled 2011-12-10 (×4): qty 1

## 2011-12-10 NOTE — Progress Notes (Signed)
Pt came to dayroom requesting a cup of ice, writer asked how her day had been and she reported that it was a little better other than her her having sweats offs and on and her arms and legs feeling tight. Writer asked if pt would like medication to  help relieve her discomfort or if she wanted to tough it out and pt requested medication. Pt received a prn of Robaxin and given crystal light. Pt denies si/hi/a/v hall. Safety maintained on unit will continue to monitor.

## 2011-12-10 NOTE — Progress Notes (Signed)
Patient ID: [name], female   DOB: [birth year], 20 y.o.   MRN: [id number]   [facility] Group Notes:  (Counselor/Nursing/MHT/Case Management/Adjunct)  12/10/2011 1:15 PM  Type of Therapy:  Group Therapy, Dance/Movement Therapy   Participation Level:  Did Not Attend  [facility]

## 2011-12-10 NOTE — Progress Notes (Signed)
Suicide Risk Assessment  Admission Assessment     Demographic factors:  Assessment Details Time of Assessment: Admission Information Obtained From: Patient Current Mental Status:  Current Mental Status:  ("I think about it but haven't maqde a plan) Loss Factors:  Loss Factors: Financial problems / change in socioeconomic status;Loss of significant relationship Historical Factors:  Historical Factors: Family history of suicide Risk Reduction Factors:     CLINICAL FACTORS:   Severe Anxiety and/or Agitation Depression:   Comorbid alcohol abuse/dependence Hopelessness Alcohol/Substance Abuse/Dependencies Previous Psychiatric Diagnoses and Treatments  COGNITIVE FEATURES THAT CONTRIBUTE TO RISK:  Thought constriction (tunnel vision)    SUICIDE RISK:   Minimal: No identifiable suicidal ideation.  Patients presenting with no risk factors but with morbid ruminations; may be classified as minimal risk based on the severity of the depressive symptoms  Suicidal Ideation:   Plan:  No  Intent:  No  Means:  No  Homicidal Ideation:   Plan:  No  Intent:  No  Means:  No  Mental Status: General Appearance /Behavior:  Casual Eye Contact:  Fair Motor Behavior:  Restlestness Speech:  Normal Level of Consciousness:  Alert Mood:  8 on a scale of 1 is the least and 10 is the most Affect:  Blunt Anxiety Level:  15 on a scale of 1 is the least and 10 is the most Thought Process:  Coherent Thought Content:  WNL Perception:  Normal Judgment:  Fair Insight:  Absent Cognition:  Orientation time, place and person Sleep:  Number of Hours: 2   Vital Signs:Blood pressure 105/76, pulse 118, temperature 97.1 F (36.2 C), temperature source Oral, resp. rate 18, height 5\' 4"  (1.626 m), weight 55.339 kg (122 lb), last menstrual period 11/19/2011. Lab Results: Results for orders placed during the hospital encounter of 12/08/11 (from the past 48 hour(s))  ETHANOL     Status: Normal   Collection  Time   12/08/11 11:07 PM      Component Value Range Comment   Alcohol, Ethyl (B) <11  0 - 11 (mg/dL)   BASIC METABOLIC PANEL     Status: Abnormal   Collection Time   12/08/11 11:07 PM      Component Value Range Comment   Sodium 136  135 - 145 (mEq/L)    Potassium 4.1  3.5 - 5.1 (mEq/L)    Chloride 100  96 - 112 (mEq/L)    CO2 29  19 - 32 (mEq/L)    Glucose, Bld 128 (*) 70 - 99 (mg/dL)    BUN 7  6 - 23 (mg/dL)    Creatinine, Ser 9.14 (*) 0.50 - 1.10 (mg/dL)    Calcium 9.4  8.4 - 10.5 (mg/dL)    GFR calc non Af Amer >90  >90 (mL/min)    GFR calc Af Amer >90  >90 (mL/min)   CBC     Status: Normal   Collection Time   12/08/11 11:07 PM      Component Value Range Comment   WBC 9.3  4.0 - 10.5 (K/uL)    RBC 4.57  3.87 - 5.11 (MIL/uL)    Hemoglobin 13.7  12.0 - 15.0 (g/dL)    HCT 78.2  95.6 - 21.3 (%)    MCV 92.6  78.0 - 100.0 (fL)    MCH 30.0  26.0 - 34.0 (pg)    MCHC 32.4  30.0 - 36.0 (g/dL)    RDW 08.6  57.8 - 46.9 (%)    Platelets 356  150 - 400 (  K/uL)   DIFFERENTIAL     Status: Normal   Collection Time   12/08/11 11:07 PM      Component Value Range Comment   Neutrophils Relative 60  43 - 77 (%)    Neutro Abs 5.6  1.7 - 7.7 (K/uL)    Lymphocytes Relative 27  12 - 46 (%)    Lymphs Abs 2.5  0.7 - 4.0 (K/uL)    Monocytes Relative 10  3 - 12 (%)    Monocytes Absolute 1.0  0.1 - 1.0 (K/uL)    Eosinophils Relative 2  0 - 5 (%)    Eosinophils Absolute 0.2  0.0 - 0.7 (K/uL)    Basophils Relative 0  0 - 1 (%)    Basophils Absolute 0.0  0.0 - 0.1 (K/uL)   URINE RAPID DRUG SCREEN (HOSP PERFORMED)     Status: Abnormal   Collection Time   12/09/11  1:47 AM      Component Value Range Comment   Opiates POSITIVE (*) NONE DETECTED     Cocaine NONE DETECTED  NONE DETECTED     Benzodiazepines NONE DETECTED  NONE DETECTED     Amphetamines NONE DETECTED  NONE DETECTED     Tetrahydrocannabinol NONE DETECTED  NONE DETECTED     Barbiturates NONE DETECTED  NONE DETECTED    POCT PREGNANCY, URINE      Status: Normal   Collection Time   12/09/11  1:52 AM      Component Value Range Comment   Preg Test, Ur NEGATIVE       Risk of self harm is related to her drug use.  She overdosed 3 weeks on Heroin not sure what she was intending to do.  She ignored her prior discharge recommendations.  Her addiction is what complicates her will to live.  She has things she would like to live for, but does not see much in the way of future plans for herself.  Risk of harm to others is minimal in that she has not been in a physical fight with another for over a year.  She has not been in a physical fight with any other person.  She has never had any wishes desires or plans to kill any one or hurt anyone.  Ildefonso Keaney 12/10/2011, 8:01 AM

## 2011-12-10 NOTE — Progress Notes (Signed)
Encouraged fluids to keep BP elevated. Also instructed to get name of long term tx. Facility in [place] so that CM can begin initial contact. Pt states she is "feeling bad"

## 2011-12-10 NOTE — Progress Notes (Signed)
[facility] Adult Inpatient Family/Significant Other Suicide Prevention Education  Suicide Prevention Education:  Patient Refusal for Family/Significant Other Suicide Prevention Education: The patient [name] has refused to provide written consent for family/significant other to be provided Family/Significant Other Suicide Prevention Education during admission and/or prior to discharge.  Physician notified.  Pt. accepted information on suicide prevention, warning signs to look for with suicide and crisis line numbers to use. The pt. agreed to call crisis line numbers if having warning signs or having thoughts of suicide.   Pt plans to go to a tx center upon D/C.  [facility] 12/10/2011, 9:15 AM

## 2011-12-10 NOTE — H&P (Signed)
Psychiatric Admission Assessment Adult  Patient Identification:  [name] Date of Evaluation:  12/10/2011 20 yo SWF History of Present Illness:: Presented to ED requesting detox from heroin. Reports using 1-2 gm daily injected  past 3 months. Was here in October and apparenty did not follow up.says she is "tired of this life" and is wanting information about long term SA programs she could go to.   Past Psychiatric History: She denied prior treatment but is very drowsy.   Substance Abuse History:  Social History:    reports that she has been smoking Cigarettes.  She has a 8 pack-year smoking history. She does not have any smokeless tobacco history on file. She reports that she drinks alcohol. She reports that she uses illicit drugs (Oxycodone, Methamphetamines, Cocaine, Heroin, and Other-see comments). UDS+opiates reports 1-2 gm Injected daily for the past 3 mos. No measurable alcohol.  Family Psych History:Father abuses alcohol   Past Medical History:     Past Medical History  Diagnosis Date  . Anxiety   . Depression   . Headache       History reviewed. No pertinent past surgical history.  Allergies: No Known Allergies  Current Medications:  Prior to Admission medications   Medication Sig Start Date End Date Taking? Authorizing Provider  PRESCRIPTION MEDICATION Take 1 tablet by mouth daily. Birth Control. Ortho-Novum 7-7-7.     Historical Provider, MD    Mental Status Examination/Evaluation: Objective:  Appearance: Fairly Groomed  Psychomotor Activity:  Decreased  Eye Contact::  Poor  Speech:  Slow  Volume:  Decreased  Mood: calm    Affect:  Blunted  Thought Process: clear rational goal oriented -get long term SA treatment    Orientation:  Full  Thought Content:  No AVH or delusions   Suicidal Thoughts:  No  Homicidal Thoughts:  No  Judgement:  Impaired  Insight:  Shallow    DIAGNOSIS:    AXIS I Substance Abuse-heroin   AXIS II Deferred  AXIS III See  medical history.  AXIS IV economic problems, educational problems, occupational problems and problems related to social environment  AXIS V 41-50 serious symptoms     Treatment Plan Summary: Admit for safety & stabilization Medically support through opiate withdrawal through use of clonidine protocol  Arrange long term SA rehab  Agree with H&P done in ED -has a small are R outer antecubital area where she has been injecting.  [name] Sherri Schrecengost PA-C

## 2011-12-10 NOTE — Progress Notes (Signed)
Requested and received Vistaril 25mg  for withdrawal related anxiety.

## 2011-12-10 NOTE — Progress Notes (Signed)
Pt isolative this shift. C/O tremors,chilling,cravings and agitation r/t opiate withdrawal.  States she was using about 2 gms per day.  Denies SI and is requesting long term tx even though she has signed a 72 hour request for d/c.  Rates depression at 9. Also reports low energy and poor sleep. Support offered and cont assessment of physical withdrawal.  Fluids encouraged. 15' checks cont for safety.

## 2011-12-10 NOTE — Progress Notes (Signed)
Adult Psychosocial Assessment Update Interdisciplinary Team  Previous [facility] admissions/discharges:  Admissions Discharges  Date:09-29-11 Date:10-05-11  Date: Date:  Date: Date:  Date: Date:  Date: Date:   Changes since the last Psychosocial Assessment (including adherence to outpatient mental health and/or substance abuse treatment, situational issues contributing to decompensation and/or relapse). Relapse on heroin (1-3 grams a day) for past 3 months             Discharge Plan 1. Will you be returning to the same living situation after discharge?   Yes: No:X      If no, what is your plan? Would like referral to in pt tx center, [facility], [facility] etc           2. Would you like a referral for services when you are discharged? Yes:X     If yes, for what services?  No:       referral to tx center "i want it this time"       Summary and Recommendations (to be completed by the evaluator)  Pt has a recent relapse and is wanting a referral to a tx program. She states "i want it this time". Pt has a 12 yr old daughter that lives with the pt.'s grandmother.   Recommendations include crisis stabilization, case management, medication management, psych education groups to teach coping skills and group therapy.                     Signature:  Gevena Mart, 12/10/2011 9:11 AM

## 2011-12-11 NOTE — Progress Notes (Signed)
Pt requested and received vistaril 25mg  prn anxiety. Anxiety decreased 1720. Tibbie Cellar RN

## 2011-12-11 NOTE — Progress Notes (Signed)
Pt is up and out in milieu interacting minimally but attending groups.  C/O overall malaise and depression r/t withdrawal.  Rates depression and hopelessness at 8. Good appetite but poor sleep.  Denies SI. Requesting long term tx and is becoming aware of severity of her addiction.  Positive support and encouragement given and 15' checks cont for safety. Continue current POC.

## 2011-12-11 NOTE — Progress Notes (Signed)
  [name] is a 21 y.o. female [id number] 1991-01-10  12/09/2011 Principal Problem:  *Heroin use   Mental Status:alert & oriented mood is calm denies SI/HI/AVH.     Subjective/Objective: Still having minor withdrawal sweats and muscle discomfort- says she has to move all the time. Was putting make up on.     Filed Vitals:   12/11/11 1151  BP: 93/60  Pulse: 99  Temp:   Resp:     Lab Results:   BMET    Component Value Date/Time   NA 136 12/08/2011 2307   K 4.1 12/08/2011 2307   CL 100 12/08/2011 2307   CO2 29 12/08/2011 2307   GLUCOSE 128* 12/08/2011 2307   BUN 7 12/08/2011 2307   CREATININE 0.49* 12/08/2011 2307   CALCIUM 9.4 12/08/2011 2307   GFRNONAA >90 12/08/2011 2307   GFRAA >90 12/08/2011 2307    Medications:  Scheduled:     . cloNIDine  0.1 mg Oral QID   Followed by  . cloNIDine  0.1 mg Oral BH-qamhs  . nicotine      . nicotine  21 mg Transdermal Daily     PRN Meds acetaminophen, alum & mag hydroxide-simeth, dicyclomine, hydrOXYzine, loperamide, magnesium hydroxide, methocarbamol, naproxen, ondansetron  Plan: continue current plan of care.   [name],[name]. 12/11/2011

## 2011-12-11 NOTE — Progress Notes (Signed)
Patient ID: [name], female   DOB: 05/16/1991, 20 y.o.   MRN: [id number]   [facility] Group Notes:  (Counselor/Nursing/MHT/Case Management/Adjunct)  12/11/2011 1:15 PM  Type of Therapy:  Group Therapy, Dance/Movement Therapy   Participation Level:  Minimal  Participation Quality:  Resistant  Affect:  Flat  Cognitive:  Oriented  Insight:  Limited  Engagement in Group:  Limited  Engagement in Therapy:  Limited  Modes of Intervention:  Clarification, Problem-solving, Role-play, Socialization and Support  Summary of Progress/Problems:  Group discussed the qualities that make a support healthy or unhealthy. Pt said that someone who is "always there no matter what" is what makes a good support to her. Pt avoided making eye contact and avoided participating in group by sitting with her knees in her chest and her fingers in her mouth. Pt did share that she wants to make her mother proud. Pt did not practice a posture of confidence and pride in order to embody positivity when in a negative state of mind as done in the group.   Malorie Bigford [facility]

## 2011-12-12 MED ORDER — PRESCRIPTION MEDICATION: 1.0000 | Freq: Every day | Status: DC

## 2011-12-12 MED ORDER — NORETHIN-ETH ESTRAD TRIPHASIC 0.5/0.75/1-35 MG-MCG PO TABS
1.0000 | ORAL_TABLET | Freq: Every day | ORAL | Status: DC
  Administered 2011-12-12: 1 via ORAL
  Filled 2011-12-12: qty 1

## 2011-12-12 NOTE — Discharge Planning (Signed)
[name] attended AM group.  [name] she is ready to be d/ced.  Denied withdrawal symptoms.  States her mother is looking into a rehab in [place] but mother is concerned that it is so far away and may not be a good facility.  She stated that she relapsed almost immediately after she left the last time despite attending IOP and going to mtgs for 3 weeks or so. Agrees that rehab is a good option, states its different this time because she really wants to quit now. We called mom together who confirmed what [name] had said about [place].  Gave mom name and number for [facility] and encouraged her to call today.  This afternoon mom called back to ask that I have [name] do a phone interview with [name] and [name].

## 2011-12-12 NOTE — Progress Notes (Signed)
Patient ID: [name], female   DOB: [birth year], 20 y.o.   MRN: [id number] Pt is calm and cooperative on approach and is attending groups with appropriate interaction. She reports her sleep is poor and denies any SI/HI or A/V hallucinations.  She is hopeful to go to long term tx in [facility] after D/C.  Her Mother is supportive and pt reports she is motivated for tx.  She plans to change the people she hangs around with.  Pt encouraged to attend groups and express her feelings.  Pt remains safe on the unit.

## 2011-12-12 NOTE — Treatment Plan (Signed)
Interdisciplinary Treatment Plan Update (Adult)  Date: 12/12/2011  Time Reviewed: 11:07 AM   Progress in Treatment: Attending groups: Yes Participating in groups: Yes Taking medication as prescribed: Yes Tolerating medication: Yes   Family/Significant othe contact made: Yes  CM and Dhalia called mother together to strategize  Patient understands diagnosis:  Yes As evidenced by asking for help with opiates, heroin Discussing patient identified problems/goals with staff:  Yes See below Medical problems stabilized or resolved:  Yes Denies suicidal/homicidal ideation: Yes  Per self inventory Issues/concerns per patient self-inventory:  Yes  Depression 4, hopelessness 5,  Positive for cravings, agitation, tremors, poor sleep, low energy Other:  New problem(s) identified: N/A  Reason for Continuation of Hospitalization: Medication stabilization  Interventions implemented related to continuation of hospitalization: D/C clonodine  Work with mother to get [name] directly into rehab from here  Additional comments:  Estimated length of stay:1-2 days  Discharge Plan:Rehab  New goal(s): N/A  Review of initial/current patient goals per problem list:   1.  Goal(s):Detox from opiates  Met:  Yes  Target date:  As evidenced by:  2.  Goal (s):Identify rehab and starting date  Met:  No  Target date:1/08  As evidenced [id number] for admission  3.  Goal(s):  Met:  No  Target date:  As evidenced by:  4.  Goal(s):  Met:  No  Target date:  As evidenced by:  Attendees: Patient:  [name] 12/12/2011 11:07 AM  Family:     Physician:  Lupe Carney 12/12/2011 11:07 AM   Nursing:    12/12/2011 11:07 AM   Case Manager:  Richelle Ito, LCSW 12/12/2011 11:07 AM   Counselor:  Ronda Fairly, LCSWA 12/12/2011 11:07 AM   Other: Shelda Jakes  12/12/2011 11:07 AM  Other:     Other:     Other:      Scribe for Treatment Team:   Daryel Gerald B, 12/12/2011 11:07 AM

## 2011-12-12 NOTE — Progress Notes (Signed)
[facility] Group Notes:  (Counselor/Nursing/MHT/Case Management/Adjunct)  12/12/2011 4:50 PM   Type of Therapy:  Processing Group at 11:00 am  Participation Level:  Minimal  Participation Quality:  Attentive  Affect:  Flat  Cognitive:  Oriented  Insight:  Unknown  Engagement in Group: Limited,  Attentive yet quiet  Engagement in Therapy:  Unknown  Modes of Intervention:  Exploration and education  Summary of Progress/Problems:  Group discussion focused on obstacles to recovery;  Sue was attentive yet quiet during discussion on halt and post acute withdrawal syndrome although she did share that she has proved to herself multiple times she cannot control her drug use. [facility] Group Notes:  (Counselor/Nursing/MHT/Case Management/Adjunct)  12/12/2011 4:50 PM   Type of Therapy:  Counseling Group at 1:15 pm  Participation Level:  Minimal  Participation Quality:  Hesitant yet attentive  Affect:  Depressed  Cognitive:  Oriented  Insight:  Unknown  Engagement in Group:  Minimal  Engagement in Therapy:  Unknown  Modes of Intervention:  Exploration and activity  Summary of Progress/Problems:  Greater provided educational aspects regarding mental health [employer] [place].  Group activity and included choosing to color samples one favorite and one least favorite. Janaysia chose orange for her least favorite and a light lavender for her very favorite and shared that she feels she spends most of her time in the orange color. Uncertain what she will need to change to get closer to the lavender.  Ronda Fairly, LCSWA 12/12/2011 4:50 PM

## 2011-12-12 NOTE — Progress Notes (Signed)
Pt came to medication window to receive her scheduled medication of clonidine, pt's blood pressure was within parameters for medication and pt was also given a pitcher of gatorade to help with hydration. Pt inquired as to when she could receive another dose of visteril for anxiety and pt was told her next dose time. Pt did appear anxious and has been biting at her nails. Pt was supported and encouraged, safety maintained, will continue to monitor, did not report any other withdrawal symptom. -SI/HI/A/V hall and no pain reported.

## 2011-12-12 NOTE — Progress Notes (Signed)
Patient ID: [name], female   DOB: Sep 22, 1991, 20 y.o.   MRN: [id number] Pt. Complains of headache administered Acetaminophen 325mg  2tabs po. Pt. Reports no headache since being here. Pt. Denies SHI, prepared for group. Pt. Reports will be leaving here to go to inpatient treatment in [facility] girls program for 3 months. Staff will continue to monitor q19min for safety.

## 2011-12-12 NOTE — Progress Notes (Signed)
[name] is a 21 y.o. female [id number] [birth year]   Diagnosis:  Axis I: opiate addiction  Vital Signs:Blood pressure 92/61, pulse 94, temperature 97.6 F (36.4 C), temperature source Oral, resp. rate 20, height 5\' 4"  (1.626 m), weight 122 lb (55.339 kg), last menstrual period 11/19/2011.  Subjective/Objective:  Mental Status: Pt is up and active in the unit milieu. General Appearance: Disheveled Behavior: Cooperative, guarded, uncooperative Eye Contact:  Good Motor Behavior:  Normal Speech:  Regular rate and rhythm, normal volume, coherent and goal directed. Level of Consciousness:  Alert Mood:  Euthymic Affect:  Appropriate Anxiety Level:  Minimal Thought Process:  Linear and organized. Thought Content:  No auditory or visual hallucinations. No evidence of psychosis. Perception:  Normal Judgment:  Poor Insight:  Absent Cognition:  Orientation time, place and person Sleep:  Number of Hours: 3.5  Appetite: improving Lab Results: none pending. Medications Scheduled:  . cloNIDine  0.1 mg Oral QID  . nicotine  21 mg Transdermal Daily  . DISCONTD: cloNIDine  0.1 mg Oral BH-qamhs   PRN Meds acetaminophen, alum & mag hydroxide-simeth, dicyclomine, hydrOXYzine, loperamide, magnesium hydroxide, methocarbamol, naproxen, ondansetron  Assessment/Plan: Continue current plan of care with no changes at this time. Will discontinue clonidine at this time due to low blood pressure. Pt. Will have ortho novum restarted.  Rona Ravens. Devota Viruet [facility] 12/12/2011

## 2011-12-13 DIAGNOSIS — F1120 Opioid dependence, uncomplicated: Principal | ICD-10-CM

## 2011-12-13 MED ORDER — NORETHIN-ETH ESTRAD TRIPHASIC 0.5/0.75/1-35 MG-MCG PO TABS: 1.0000 | ORAL_TABLET | Freq: Every day | ORAL | Status: DC

## 2011-12-13 NOTE — Treatment Plan (Signed)
Interdisciplinary Treatment Plan Update (Adult)  Date: 12/13/2011  Time Reviewed: 11:41 AM   Progress in Treatment: Attending groups: Yes Participating in groups: Yes Taking medication as prescribed: Yes Tolerating medication: Yes   Family/Significant othe contact made:   Patient understands diagnosis:  Yes Discussing patient identified problems/goals with staff:  Yes Medical problems stabilized or resolved:  Yes Denies suicidal/homicidal ideation: Yes Issues/concerns per patient self-inventory:  Yes Other:  New problem(s) identified: N/A  Reason for Continuation of Hospitalization: Other; describe D/C today  Interventions implemented related to continuation of hospitalization:   Additional comments:  Estimated length of stay:  Discharge Plan:Go to [facility] today in [facility] New goal(s): N/A  Review of initial/current patient goals per problem list:   1.  Goal(s):Detox from opiates  Met:  Yes  Target date:  As evidenced by:  2.  Goal (s):Identfiy rehab and start date  Met:  Yes  Target date:  As evidenced by:  3.  Goal(s):  Met:  Yes  Target date:  As evidenced by:  4.  Goal(s):  Met:  Yes  Target date:  As evidenced by:  Attendees: Patient: [name]  12/13/2011 11:41 AM  Family:     Physician:  Lupe Carney 12/13/2011 11:41 AM   Nursing: Cassie Freer  12/13/2011 11:41 AM   Case Manager:  Richelle Ito, LCSW 12/13/2011 11:41 AM   Counselor:  Ronda Fairly, LCSWA 12/13/2011 11:41 AM   Other:     Other:     Other:     Other:      Scribe for Treatment Team:   Ida Rogue, 12/13/2011 11:41 AM

## 2011-12-13 NOTE — Progress Notes (Signed)
Suicide Risk Assessment  Discharge Assessment     Demographic factors: Please see assessment details.   Current Mental Status: SI upon admission w/o plan.  Patient seen and evaluated in treatment team this am. Chart reviewed. Patient stated that her mood was "good and she was looking forward to going to rehab". Her affect was mood congruent and euthymic. She denied any current thoughts of self injurious behavior, suicidal ideation or homicidal ideation. She denied any significant depressive signs or symptoms at this time. There were no auditory or visual hallucinations, paranoia, delusional thought processes, or mania noted.  Thought process was linear and goal directed.  No psychomotor agitation or retardation was noted. Her speech was normal rate, tone and volume. Eye contact was good. Judgment and insight are fair.  Patient has been up and engaged on the unit.  No safety concerns reported from team.   Loss Factors: Financial problems / change in socioeconomic status; Loss of significant relationship   Historical Factors: Family history of suicide   Risk Reduction Factors: Interest in Tx; SA f/u/residential Tx  CLINICAL FACTORS (noted upon admission):  Severe Anxiety   Depression: Comorbid alcohol abuse/dependence  Hopelessness  Alcohol/Substance Abuse/Dependencies  Previous Psychiatric Diagnoses and Treatments   COGNITIVE FEATURES THAT CONTRIBUTE TO RISK: limited insight; impulsivity  Vitals: Filed Vitals:   12/13/11 0701  BP: 107/69  Pulse: 102  Temp:   Resp:     Meds:   . nicotine  21 mg Transdermal Daily  . norethindrone-ethinyl estradiol  1 tablet Oral Q2000  . DISCONTD: norethindrone-ethinyl estradiol  1 tablet Oral Daily  . DISCONTD: PRESCRIPTION MEDICATION 1 tablet  1 tablet Oral Daily    SUICIDE RISK: Patient is currently viewed as a low-moderate risk of harm to herself and others in light of her history and risk factors. There are no acute safety concerns and she  is stable for discharge. Her continued sobriety and SA followup will mitigate against any potential risk in the future. She is agreeable with the plan.  Discussed with the team.  Plan: Pt stable for and requesting discharge.  Team discussed with mother, transfer to [facility] residential Tx program tonight.  Pt contracting for safety and does not currently meet [place] involuntary commitment criteria for continued hospitalization.  Mental health treatment and continued sobriety will mitigate against the increased risk of harm to self and/or others.  Discussed the importance of recovery further with pt, as well as, tools to move forward in a healthy & safe manner.  Pt agreeable with the plan.  Discussed with the team.  Please see orders, follow up plans per team and full discharge summary to be completed by physician extender.  No w/d s/s reported.  Pt's HR elevated and with c/o cold s/s.  Pt afebrile, no c/o CP/SOB/N/V/D.  Lupe Carney 12/13/2011, 11:26 AM

## 2011-12-13 NOTE — Progress Notes (Signed)
Patient discharged to home today.  Reviewed all discharge instructions, medications, and follow up care.  Verbalized understanding of all.  Was going to go to a treatment facility in [facility], but is now going to a facility in [place] [facility].  She will be staying with her mother until transportation can be arranged.  Patient left the unit ambulatory, belongings retrieved from locker.  Escorted to the front lobby where her ride was waiting on her.

## 2011-12-13 NOTE — Progress Notes (Signed)
Recreation Therapy Group Note  Date: 12/13/2011         Time: 1000       Group Topic/Focus: Patient invited to participate in animal assisted therapy. Pets as a coping skill and responsibility were discussed.   Participation Level: Minimal  Participation Quality: Resistant  Affect: Irritable  Cognitive: Oriented   Additional Comments: Patient in and out of group room, was not present when the therapeutic benefits of animal assisted therapy were discussed.

## 2011-12-13 NOTE — Progress Notes (Signed)
[facility] Case Management Discharge Plan:  Will you be returning to the same living situation after discharge: No. Would you like a referral for services when you are discharged:Yes,  [facility] Do you have access to transportation at discharge:No. Do you have the ability to pay for your medications:Yes,  None prescribed  Interagency Information:     Patient to Follow up at:  Follow-up Information    Follow up with [facility] on 12/13/2011. (Check-in today at 6:30 PM)    Contact information:   [address] [place] [address]  [phone]         Patient denies SI/HI:   Yes,  yes    Safety Planning and Suicide Prevention discussed:  Yes,  yes  Barrier to discharge identified:No.  Summary and Recommendations:   [name] 12/13/2011, 11:45 AM

## 2011-12-15 NOTE — Progress Notes (Signed)
Patient Discharge Instructions:  Admission Note Faxed,  12/14/2011 After Visit Summary Faxed,  12/14/2011 Faxed to the Next Level Care provider:  12/14/2011 Facesheet faxed 12/14/2011   No fax available, provider requested and records were sent certified mail to [facility] @ [address]  [place], [place] [address]  Wandra Scot, 12/15/2011, 11:01 AM

## 2011-12-19 NOTE — Discharge Summary (Signed)
  [name] 11/06/1991 20 y.o. [id number]    Date of Admission: 12/10/2011 Date of Discharge:  12/13/2011 Diagnosis:Opiate Addiction Diagnosis on Discharge:  Opiate Addiction  HPI: [name] was admitted to [facility] from ED where she presented with opiate addiction and reported wanted help with detox. Hospital Course:      The duration of [name]'s stay at [facility] was unremarkable.      The patient was seen and evaluated by the Treatment team consisting of Psychiatrist, PAC, RN, Case Manager, and Therapist for evaluation and treatment plan with goal of stabilization upon discharge. The patient's physical and mental health problems were identified and treated appropriately.      Multiple modalities of treatment were used including medication, individual and group therapies, unit programming, AA/NA, improved nutrition, physical activity, and family sessions as needed.     The symptoms of alcohol/substance abuse withdrawal were monitored daily by serial clinical withdrawal scores. Improvement was demonstrated by declining CIWA/COWS numbers, improving vital signs, increased cognition, and improvement in mood, sleep, appetite as well as a reduction in psychosocial symptoms.       The patient was evaluated and found to be stable enough for discharge and was released to rehab per the initial plan of treatment.  Labs: unremarkable  Level of Care:  Long-term IP psych.  Meds on Discharge:@PTMEDSDISCHARGNOCOM @  Is patient on multiple antipsychotic therapies at discharge:  No    Has Patient had three or more failed trials of antipsychotic monotherapy by history:  No  Follow-up recommendations:  Other:  Continue with rehab program as planned. Discharge destination:  [facility] Comments:  None  Oron Westrup for @DR .Alyson Kuroski-MazzieMD@ 12/19/2011

## 2012-03-13 NOTE — Progress Notes (Unsigned)
Patient ID: [name], female   DOB: 10/28/1991, 20 y.o.   MRN: [id number]

## 2012-07-20 ENCOUNTER — Ambulatory Visit: Payer: 59

## 2012-07-31 ENCOUNTER — Ambulatory Visit (INDEPENDENT_AMBULATORY_CARE_PROVIDER_SITE_OTHER): Payer: 59 | Admitting: Family Medicine

## 2012-07-31 VITALS — BP 100/62 | HR 105 | Temp 99.3°F | Resp 16 | Ht 63.75 in | Wt 127.8 lb

## 2012-07-31 DIAGNOSIS — L0390 Cellulitis, unspecified: Secondary | ICD-10-CM

## 2012-07-31 DIAGNOSIS — L0291 Cutaneous abscess, unspecified: Secondary | ICD-10-CM

## 2012-07-31 DIAGNOSIS — R52 Pain, unspecified: Secondary | ICD-10-CM

## 2012-07-31 MED ORDER — DOXYCYCLINE HYCLATE 100 MG PO TABS: 100.0000 mg | ORAL_TABLET | Freq: Two times a day (BID) | ORAL | Status: AC

## 2012-07-31 MED ORDER — IBUPROFEN 800 MG PO TABS: 800.0000 mg | ORAL_TABLET | Freq: Three times a day (TID) | ORAL | Status: AC | PRN

## 2012-07-31 NOTE — Progress Notes (Signed)
[facility]:  Office Visit  Chief Complaint:  Chief Complaint  Patient presents with  . Abscess    x 2-3 days  pubic area  . Amenorrhea    x 9 months    HPI: [name] is a 21 y.o. female who complains of : 1. Left labial abscess started 2days ago, draining pus today after warm compresses. nsaid for pain 2. Has been without period for 9 months, stopped taking birth control and started becoming ammenorheic. Marland Kitchen Prior to this was irregular. She has had negative pregnancy tests since.  Last menstrual cycle was christmas.   Past Medical History  Diagnosis Date  . Anxiety   . Depression   . Headache   . Substance abuse    No past surgical history on file. History   Social History  . Marital Status: Single    Spouse Name: N/A    Number of Children: 1  . Years of Education: GED   Occupational History  . unemployed    Social History Main Topics  . Smoking status: Current Everyday Smoker -- 2.0 packs/day for 4 years    Types: Cigarettes  . Smokeless tobacco: None  . Alcohol Use: Yes     used very rarely per self-report  . Drug Use: Yes    Special: Oxycodone, Methamphetamines, Cocaine, Heroin, Other-see comments  . Sexually Active: Yes    Birth Control/ Protection: Pill     pt had her daughter when she was 58 years old   Other Topics Concern  . None   Social History Narrative  . None   Family History  Problem Relation Age of Onset  . Alcohol abuse Father   . Drug abuse Father   . Depression Father   . Anxiety disorder Father    No Known Allergies Prior to Admission medications   Medication Sig Start Date End Date Taking? Authorizing Provider  PRESCRIPTION MEDICATION Take 1 tablet by mouth daily. Birth Control. Ortho-Novum 7-7-7.     Historical Provider, MD     ROS: The patient denies fevers, chills, night sweats, unintentional weight loss, chest pain, palpitations, wheezing, dyspnea on exertion, nausea, vomiting, abdominal pain,  dysuria, hematuria, melena, numbness, weakness, or tingling.  All other systems have been reviewed and were otherwise negative with the exception of those mentioned in the HPI and as above.    PHYSICAL EXAM: Filed Vitals:   07/31/12 1432  BP: 100/62  Pulse: 105  Temp: 99.3 F (37.4 C)  Resp: 16   Filed Vitals:   07/31/12 1432  Height: 5' 3.75" (1.619 m)  Weight: 127 lb 12.8 oz (57.97 kg)   Body mass index is 22.11 kg/(m^2).  General: Alert, no acute distress HEENT:  Normocephalic, atraumatic, oropharynx patent.  Cardiovascular:  Regular rate and rhythm, no rubs murmurs or gallops.  No Carotid bruits, radial pulse intact. No pedal edema.  Respiratory: Clear to auscultation bilaterally.  No wheezes, rales, or rhonchi.  No cyanosis, no use of accessory musculature GI: No organomegaly, abdomen is soft and non-tender, positive bowel sounds.  No masses. Skin: + left labial abscess, draining purulent drainage. tender Neurologic: Facial musculature symmetric. Psychiatric: Patient is appropriate throughout our interaction. Lymphatic: No cervical lymphadenopathy Musculoskeletal: Gait intact.   LABS: Results for orders placed during the hospital encounter of 12/08/11  ETHANOL      Component Value Range   Alcohol, Ethyl (B) <11  0 - 11 mg/dL  URINE RAPID DRUG SCREEN (HOSP PERFORMED)  Component Value Range   Opiates POSITIVE (*) NONE DETECTED   Cocaine NONE DETECTED  NONE DETECTED   Benzodiazepines NONE DETECTED  NONE DETECTED   Amphetamines NONE DETECTED  NONE DETECTED   Tetrahydrocannabinol NONE DETECTED  NONE DETECTED   Barbiturates NONE DETECTED  NONE DETECTED  BASIC METABOLIC PANEL      Component Value Range   Sodium 136  135 - 145 mEq/L   Potassium 4.1  3.5 - 5.1 mEq/L   Chloride 100  96 - 112 mEq/L   CO2 29  19 - 32 mEq/L   Glucose, Bld 128 (*) 70 - 99 mg/dL   BUN 7  6 - 23 mg/dL   Creatinine, Ser 1.61 (*) 0.50 - 1.10 mg/dL   Calcium 9.4  8.4 - 09.6 mg/dL   GFR  calc non Af Amer >90  >90 mL/min   GFR calc Af Amer >90  >90 mL/min  CBC      Component Value Range   WBC 9.3  4.0 - 10.5 K/uL   RBC 4.57  3.87 - 5.11 MIL/uL   Hemoglobin 13.7  12.0 - 15.0 g/dL   HCT 04.5  40.9 - 81.1 %   MCV 92.6  78.0 - 100.0 fL   MCH 30.0  26.0 - 34.0 pg   MCHC 32.4  30.0 - 36.0 g/dL   RDW 91.4  78.2 - 95.6 %   Platelets 356  150 - 400 K/uL  DIFFERENTIAL      Component Value Range   Neutrophils Relative 60  43 - 77 %   Neutro Abs 5.6  1.7 - 7.7 K/uL   Lymphocytes Relative 27  12 - 46 %   Lymphs Abs 2.5  0.7 - 4.0 K/uL   Monocytes Relative 10  3 - 12 %   Monocytes Absolute 1.0  0.1 - 1.0 K/uL   Eosinophils Relative 2  0 - 5 %   Eosinophils Absolute 0.2  0.0 - 0.7 K/uL   Basophils Relative 0  0 - 1 %   Basophils Absolute 0.0  0.0 - 0.1 K/uL  POCT PREGNANCY, URINE      Component Value Range   Preg Test, Ur NEGATIVE       EKG/XRAY:   Primary read interpreted by Dr. Conley Rolls at [facility].   ASSESSMENT/PLAN: Encounter Diagnoses  Name Primary?  Marland Kitchen Abscess Yes  . Pain    1. Rx Doxycycline, Ibuprofen, self draining, so only  packed but will probably fall out. If no improvement then return in 2 days. Wound culture pending from left labia Did npt give narcotics due to prior hx of heroin addictio. rx ibuprofen 800 mg prn 2. Restart birth control, if periods do not return then please follow-up.     Hamilton Capri PHUONG, DO 07/31/2012 11:55 PM

## 2012-08-03 LAB — WOUND CULTURE
Gram Stain: NONE SEEN
Gram Stain: NONE SEEN

## 2014-08-16 ENCOUNTER — Emergency Department (HOSPITAL_COMMUNITY): Payer: PRIVATE HEALTH INSURANCE

## 2014-08-16 ENCOUNTER — Encounter (HOSPITAL_COMMUNITY): Payer: Self-pay | Admitting: Emergency Medicine

## 2014-08-16 ENCOUNTER — Emergency Department (HOSPITAL_COMMUNITY)
Admission: EM | Admit: 2014-08-16 | Discharge: 2014-08-16 | Disposition: A | Discharge status: Home / Self Care | Payer: PRIVATE HEALTH INSURANCE | Attending: Emergency Medicine | Admitting: Emergency Medicine

## 2014-08-16 DIAGNOSIS — Y9389 Activity, other specified: Secondary | ICD-10-CM | POA: Insufficient documentation

## 2014-08-16 DIAGNOSIS — T401X4A Poisoning by heroin, undetermined, initial encounter: Secondary | ICD-10-CM | POA: Diagnosis present

## 2014-08-16 DIAGNOSIS — Y9289 Other specified places as the place of occurrence of the external cause: Secondary | ICD-10-CM | POA: Insufficient documentation

## 2014-08-16 DIAGNOSIS — Z79899 Other long term (current) drug therapy: Secondary | ICD-10-CM | POA: Diagnosis not present

## 2014-08-16 DIAGNOSIS — T401X1A Poisoning by heroin, accidental (unintentional), initial encounter: Secondary | ICD-10-CM | POA: Diagnosis not present

## 2014-08-16 DIAGNOSIS — F17200 Nicotine dependence, unspecified, uncomplicated: Secondary | ICD-10-CM | POA: Insufficient documentation

## 2014-08-16 DIAGNOSIS — Z8659 Personal history of other mental and behavioral disorders: Secondary | ICD-10-CM | POA: Diagnosis not present

## 2014-08-16 NOTE — Discharge Instructions (Signed)
Narcotic Overdose A narcotic overdose is the misuse or overuse of a narcotic drug. A narcotic overdose can make you pass out and stop breathing. If you are not treated right away, this can cause permanent brain damage or stop your heart. Medicine may be given to reverse the effects of an overdose. If so, this medicine may bring on withdrawal symptoms. The symptoms may be abdominal cramps, throwing up (vomiting), sweating, chills, and nervousness. Injecting narcotics can cause more problems than just an overdose. AIDS, hepatitis, and other very serious infections are transmitted by sharing needles and syringes. If you decide to quit using, there are medicines which can help you through the withdrawal period. Trying to quit all at once on your own can be uncomfortable, but not life-threatening. Call your caregiver, Narcotics Anonymous, or any drug and alcohol treatment program for further help.  Document Released: 12/29/2004 Document Revised: 02/13/2012 Document Reviewed: 10/23/2009 [facility] Patient Information 2015 [place], [place]. This information is not intended to replace advice given to you by your health care provider. Make sure you discuss any questions you have with your health care provider.  Accidental Overdose A drug overdose occurs when a chemical substance (drug or medication) is used in amounts large enough to overcome a person. This may result in severe illness or death. This is a type of poisoning. Accidental overdoses of medications or other substances come from a variety of reasons. When this happens accidentally, it is often because the person taking the substance does not know enough about what they have taken. Drugs which commonly cause overdose deaths are alcohol, psychotropic medications (medications which affect the mind), pain medications, illegal drugs (street drugs) such as cocaine and heroin, and multiple drugs taken at the same time. It may result from careless behavior (such as  over-indulging at a party). Other causes of overdose may include multiple drug use, a lapse in memory, or drug use after a period of no drug use.  Sometimes overdosing occurs because a person cannot remember if they have taken their medication.  A common unintentional overdose in young children involves multi-vitamins containing iron. Iron is a part of the hemoglobin molecule in blood. It is used to transport oxygen to living cells. When taken in small amounts, iron allows the body to restock hemoglobin. In large amounts, it causes problems in the body. If this overdose is not treated, it can lead to death. Never take medicines that show signs of tampering or do not seem quite right. Never take medicines in the dark or in poor lighting. Read the label and check each dose of medicine before you take it. When adults are poisoned, it happens most often through carelessness or lack of information. Taking medicines in the dark or taking medicine prescribed for someone else to treat the same type of problem is a dangerous practice. SYMPTOMS  Symptoms of overdose depend on the medication and amount taken. They can vary from over-activity with stimulant over-dosage, to sleepiness from depressants such as alcohol, narcotics and tranquilizers. Confusion, dizziness, nausea and vomiting may be present. If problems are severe enough coma and death may result. DIAGNOSIS  Diagnosis and management are generally straightforward if the drug is known. Otherwise it is more difficult. At times, certain symptoms and signs exhibited by the patient, or blood tests, can reveal the drug in question.  TREATMENT  In an emergency department, most patients can be treated with supportive measures. Antidotes may be available if there has been an overdose of opioids or benzodiazepines.  A rapid improvement will often occur if this is the cause of overdose. At home or away from medical care:  There may be no immediate problems or warning  signs in children.  Not everything works well in all cases of poisoning.  Take immediate action. Poisons may act quickly.  If you think someone has swallowed medicine or a household product, and the person is unconscious, having seizures (convulsions), or is not breathing, immediately call for an ambulance. IF a person is conscious and appears to be doing OK but has swallowed a poison:  Do not wait to see what effect the poison will have. Immediately call a poison control center (listed in the white pages of your telephone book under "Poison Control" or inside the front cover with other emergency numbers). Some poison control centers have TTY capability for the deaf. Check with your local center if you or someone in your family requires this service.  Keep the container so you can read the label on the product for ingredients.  Describe what, when, and how much was taken and the age and condition of the person poisoned. Inform them if the person is vomiting, choking, drowsy, shows a change in color or temperature of skin, is conscious or unconscious, or is convulsing.  Do not cause vomiting unless instructed by medical personnel. Do not induce vomiting or force liquids into a person who is convulsing, unconscious, or very drowsy. Stay calm and in control.   Activated charcoal also is sometimes used in certain types of poisoning and you may wish to add a supply to your emergency medicines. It is available without a prescription. Call a poison control center before using this medication. PREVENTION  Thousands of children die every year from unintentional poisoning. This may be from household chemicals, poisoning from carbon monoxide in a car, taking their parent's medications, or simply taking a few iron pills or vitamins with iron. Poisoning comes from unexpected sources.  Store medicines out of the sight and reach of children, preferably in a locked cabinet. Do not keep medications in a food  cabinet. Always store your medicines in a secure place. Get rid of expired medications.  If you have children living with you or have them as occasional guests, you should have child-resistant caps on your medicine containers. Keep everything out of reach. Child proof your home.  If you are called to the telephone or to answer the door while you are taking a medicine, take the container with you or put the medicine out of the reach of small children.  Do not take your medication in front of children. Do not tell your child how good a medication is and how good it is for them. They may get the idea it is more of a treat.  If you are an adult and have accidentally taken an overdose, you need to consider how this happened and what can be done to prevent it from happening again. If this was from a street drug or alcohol, determine if there is a problem that needs addressing. If you are not sure a problems exists, it is easy to talk to a professional and ask them if they think you have a problem. It is better to handle this problem in this way before it happens again and has a much worse consequence. Document Released: 02/04/2005 Document Revised: 02/13/2012 Document Reviewed: 07/13/2009 [facility] Patient Information 2015 [place], [place]. This information is not intended to replace advice given to you by your health  care provider. Make sure you discuss any questions you have with your health care provider.   Behavioral Health Resources in the [facility]  Intensive Outpatient Programs: [facility]      [address] [place], [place] [phone] Both a day and evening program       [facility]     [address]        [place], [place] [address] [phone]         ADS: [facility] [address] [place] [phone]  [facility] Mental Health ACCESS LINE: [phone] or [phone] [address] [place], [place] [address] [email or web address]  Mobile Crisis Teams:                                        [facility]         Mobile Crisis Care Unit [phone]             [facility] [address] Ginette Otto [phone]                                         [facility] [address] [place] [phone]  Self-Help/Support Groups: [facility] [employer] of support groups [phone] (call for more info)  Narcotics Anonymous (NA) [facility] [address] [place] - 2 meetings at this location  Residential Treatment Programs:  [facility]      [address]        [place]       [phone]         [facility] [address], [place] [address] [place], [place]  [address] [phone]  [facility]  [address] [place], [place] [address] [phone] Admissions: 8am-3pm M-F  [facility]     [address]        [place], [place] [address]       [phone]         [facility] [address] [place], [place] [phone]  [facility] [address] [place], [place] [phone]  [facility] - Intensive Outpatient      [address]     [place], [place]       [phone]         [facility] ([facility]     [address] [place], [place] [phone] or [phone]  [facility] ([facility])  [address] [place], [place] [phone]  [facility] [address]                                               [address] [place] [phone]  [facility] Resources: [phone]               General Therapy  Angie Fava, PhD        [address] [place], [place] [address]         [phone]   Insurance  [facility]   [address] [place], [place] [address] [phone]  [facility] [address] [place], [place] [address] [phone] Insurance/Medicaid/sponsorship through [facility] and Families                                              [address]                                        [place], [place] [address]    Therapy/tele-psych/case         [phone]          [facility] [address], [place]  [address]  Adolescent/group home/case management [phone]                                           Creola Corn PhD       General therapy       Insurance   [id number]         Dr. Lolly Mustache Insurance [phone] M-F  [place] [facility] Medicaid, sponsorship [phone]

## 2014-08-16 NOTE — ED Notes (Signed)
MD at bedside. 

## 2014-08-16 NOTE — ED Notes (Signed)
Bed: RESA Expected date: 08/16/14 Expected time: 1:23 AM Means of arrival: Ambulance Comments: Intubated/overdose

## 2014-08-16 NOTE — ED Provider Notes (Signed)
CSN: [id number]     Arrival date & time 08/16/14  0140 History   First MD Initiated Contact with Patient 08/16/14 0141     Chief Complaint  Patient presents with  . Drug Overdose     (Consider location/radiation/quality/duration/timing/severity/associated sxs/prior Treatment) HPI Presents to emergency EMS after drug overdose.  Per EMS, patient was found unresponsive by fire rescue placed to [facility] airway.  EMS gave 2 mg of Narcan without improvement.  They changed at the [facility] airway to a ETT tube.  Patient was given an additional 2 mg of Narcan with which she woke up and self extubated.  EMS reports some blood in the posterior pharynx with their intubation.  Patient admits to snorting heroin tonight.  She reports that she does not often use heroin, usually takes prescription narcotics.  She has been unable to find any prescription narcotics, and so started hearing.  She is unsure of the source of the heroin.  She denies IV drug use.  Patient denies suicide attempt.  At this time she is not interested in detox.  She is complaining of sore throat. Past Medical History  Diagnosis Date  . Anxiety   . Depression   . Headache(784.0)   . Substance abuse    History reviewed. No pertinent past surgical history. Family History  Problem Relation Age of Onset  . Alcohol abuse Father   . Drug abuse Father   . Depression Father   . Anxiety disorder Father    History  Substance Use Topics  . Smoking status: Current Every Day Smoker -- 2.00 packs/day for 4 years    Types: Cigarettes  . Smokeless tobacco: Not on file  . Alcohol Use: Yes     Comment: used very rarely per self-report   OB History   Grav Para Term Preterm Abortions TAB SAB Ect Mult Living                 Review of Systems   See History of Present Illness; otherwise all other systems are reviewed and negative  Allergies  Review of patient's allergies indicates no known allergies.  Home Medications   Prior to Admission  medications   Medication Sig Start Date End Date Taking? Authorizing Provider  PRESCRIPTION MEDICATION Take 1 tablet by mouth daily. Birth Control.   Yes Historical Provider, MD   BP 112/82  Pulse 100  Temp(Src) 98.2 F (36.8 C) (Oral)  SpO2 100%  LMP 08/13/2014 Physical Exam  Nursing note and vitals reviewed. Constitutional: She is oriented to person, place, and time. She appears well-developed and well-nourished. She appears distressed (tearful, upset).  HENT:  Head: Normocephalic and atraumatic.  Nose: Nose normal.  Mouth/Throat: Oropharynx is clear and moist.  Eyes: Conjunctivae and EOM are normal. Pupils are equal, round, and reactive to light.  Neck: Normal range of motion. Neck supple. No JVD present. No tracheal deviation present. No thyromegaly present.  Cardiovascular: Normal rate, regular rhythm, normal heart sounds and intact distal pulses.  Exam reveals no gallop and no friction rub.   No murmur heard. Pulmonary/Chest: Effort normal and breath sounds normal. No stridor. No respiratory distress. She has no wheezes. She has no rales. She exhibits no tenderness.  Abdominal: Soft. Bowel sounds are normal. She exhibits no distension and no mass. There is no tenderness. There is no rebound and no guarding.  Musculoskeletal: Normal range of motion. She exhibits no edema and no tenderness.  Lymphadenopathy:    She has no cervical adenopathy.  Neurological:  She is alert and oriented to person, place, and time. She displays normal reflexes. She exhibits normal muscle tone. Coordination normal.  Skin: Skin is warm and dry. No rash noted. No erythema. No pallor.  Psychiatric: She has a normal mood and affect. Her behavior is normal. Judgment and thought content normal.    ED Course  Procedures (including critical care time) Labs Review Labs Reviewed - No data to display  Imaging Review Dg Chest 2 View  08/16/2014   CLINICAL DATA:  Overdose  EXAM: CHEST  2 VIEW  COMPARISON:   None.  FINDINGS: The heart size and mediastinal contours are within normal limits. Both lungs are clear. The visualized skeletal structures are unremarkable.  IMPRESSION: No active cardiopulmonary disease.   Electronically Signed   By: Rise Mu M.D.   On: 08/16/2014 02:37     EKG Interpretation   Date/Time:  Saturday August 16 2014 01:43:10 EDT Ventricular Rate:  97 PR Interval:  132 QRS Duration: 85 QT Interval:  367 QTC Calculation: 466 R Axis:   77 Text Interpretation:  Sinus rhythm RSR' in V1 or V2, probably normal  variant Borderline repolarization abnormality No old tracing to compare  Confirmed by Terree Gaultney  MD, Deakin Lacek ([id number]) on 08/16/2014 1:45:23 AM      MDM   Final diagnoses:  Heroin overdose, accidental or unintentional, initial encounter   23 year old female status post heroin overdose, with self extubation after 4 mg of Narcan.  Patient is awake and alert at this time.  We'll monitor for continued alertness.  Will check chest x-ray.  4:06 AM Patient has been stable since being in the).  Plan to discharge home.   Olivia Mackie, MD 08/16/14 [phone]

## 2014-08-16 NOTE — ED Notes (Signed)
Patient found unresponsive and mother called EMS. Patient had snorted heroin tonight. Patient was intubated on the scene, intubated, and later extubated herself after receiving narcan. Patient denies suicide attempt.

## 2015-08-01 IMAGING — CR DG CHEST 2V
2 series · 2 of 2 positions shown · non-contrast
Comparison: None.

CLINICAL DATA: Overdose

EXAM:
CHEST  2 VIEW

[w chest lat]
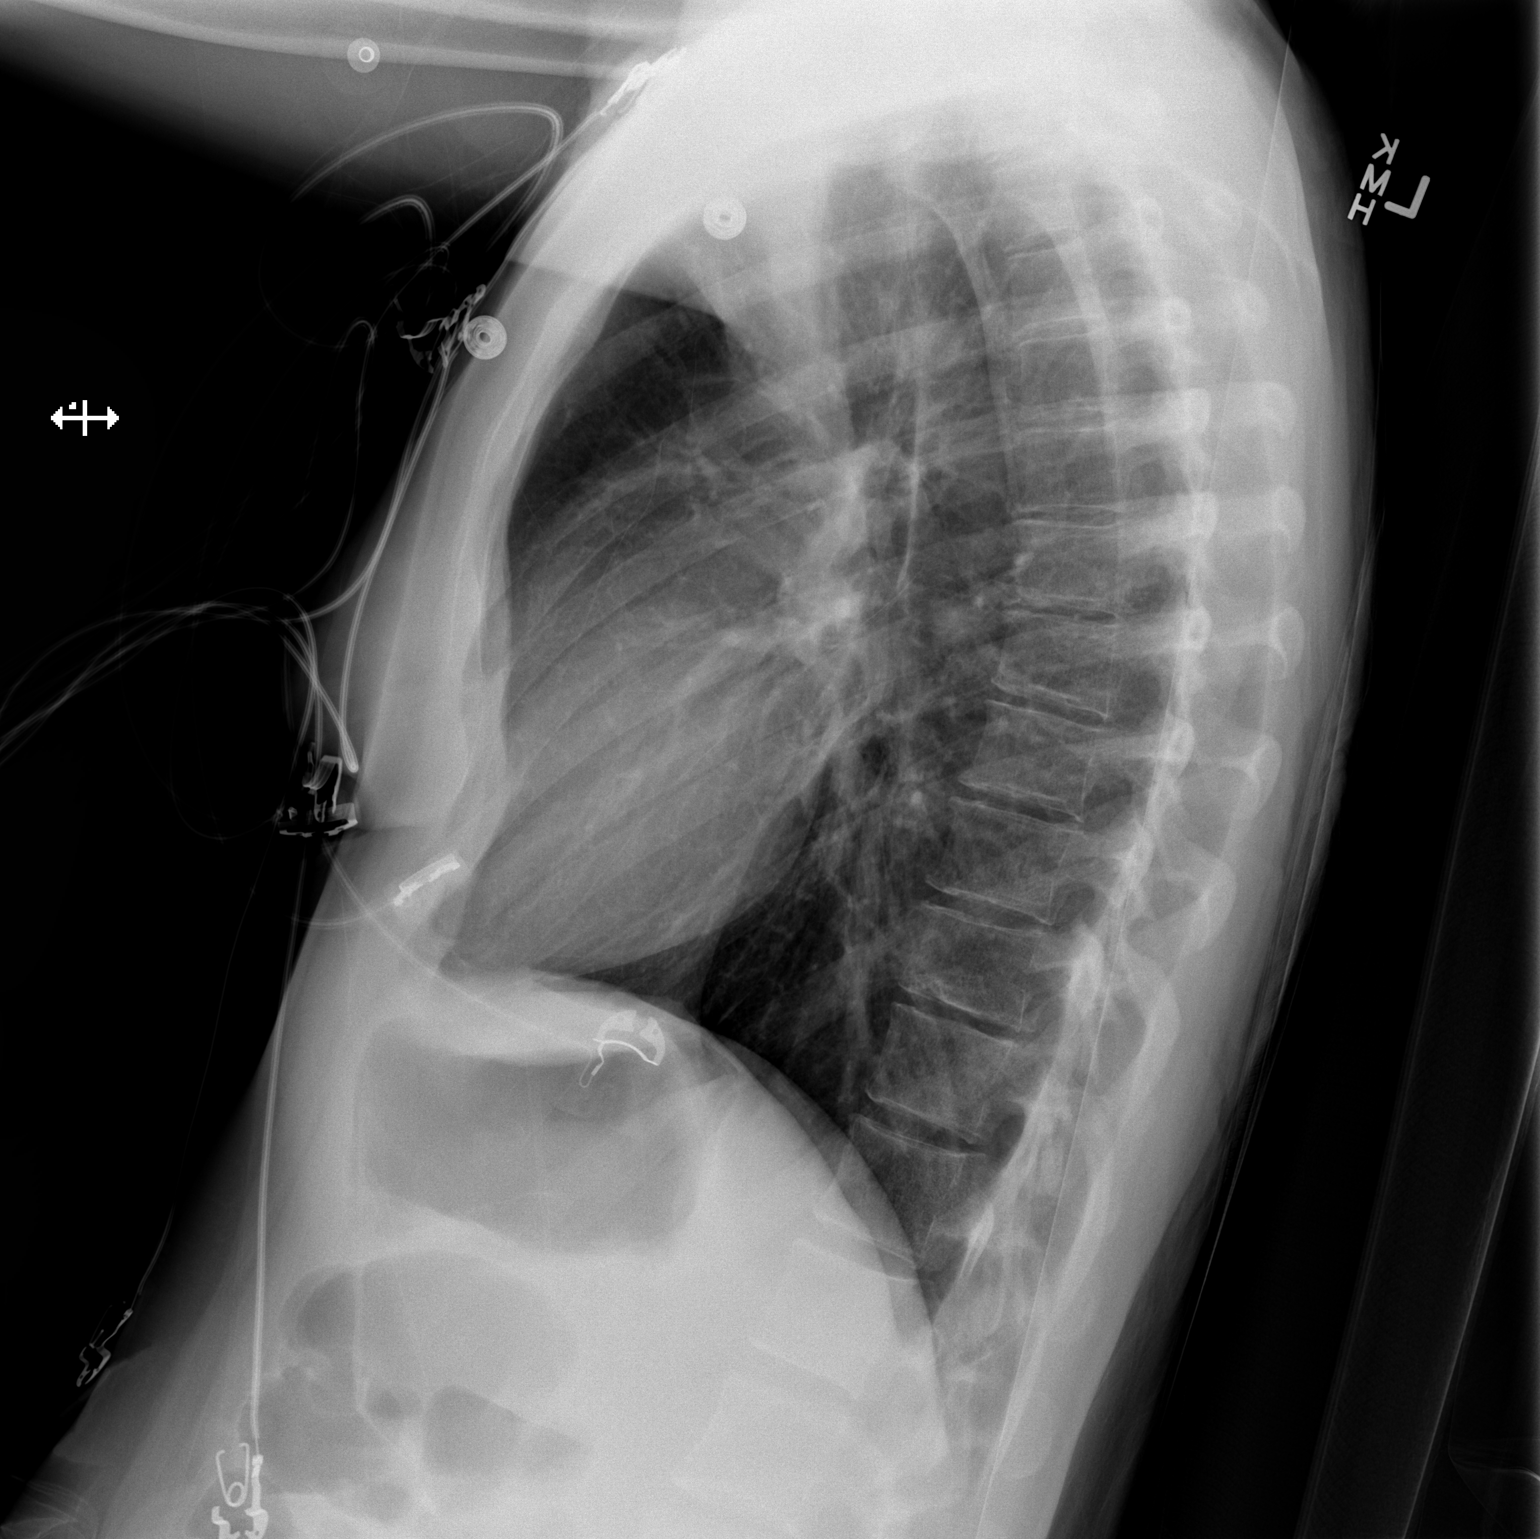

[x chest ap]
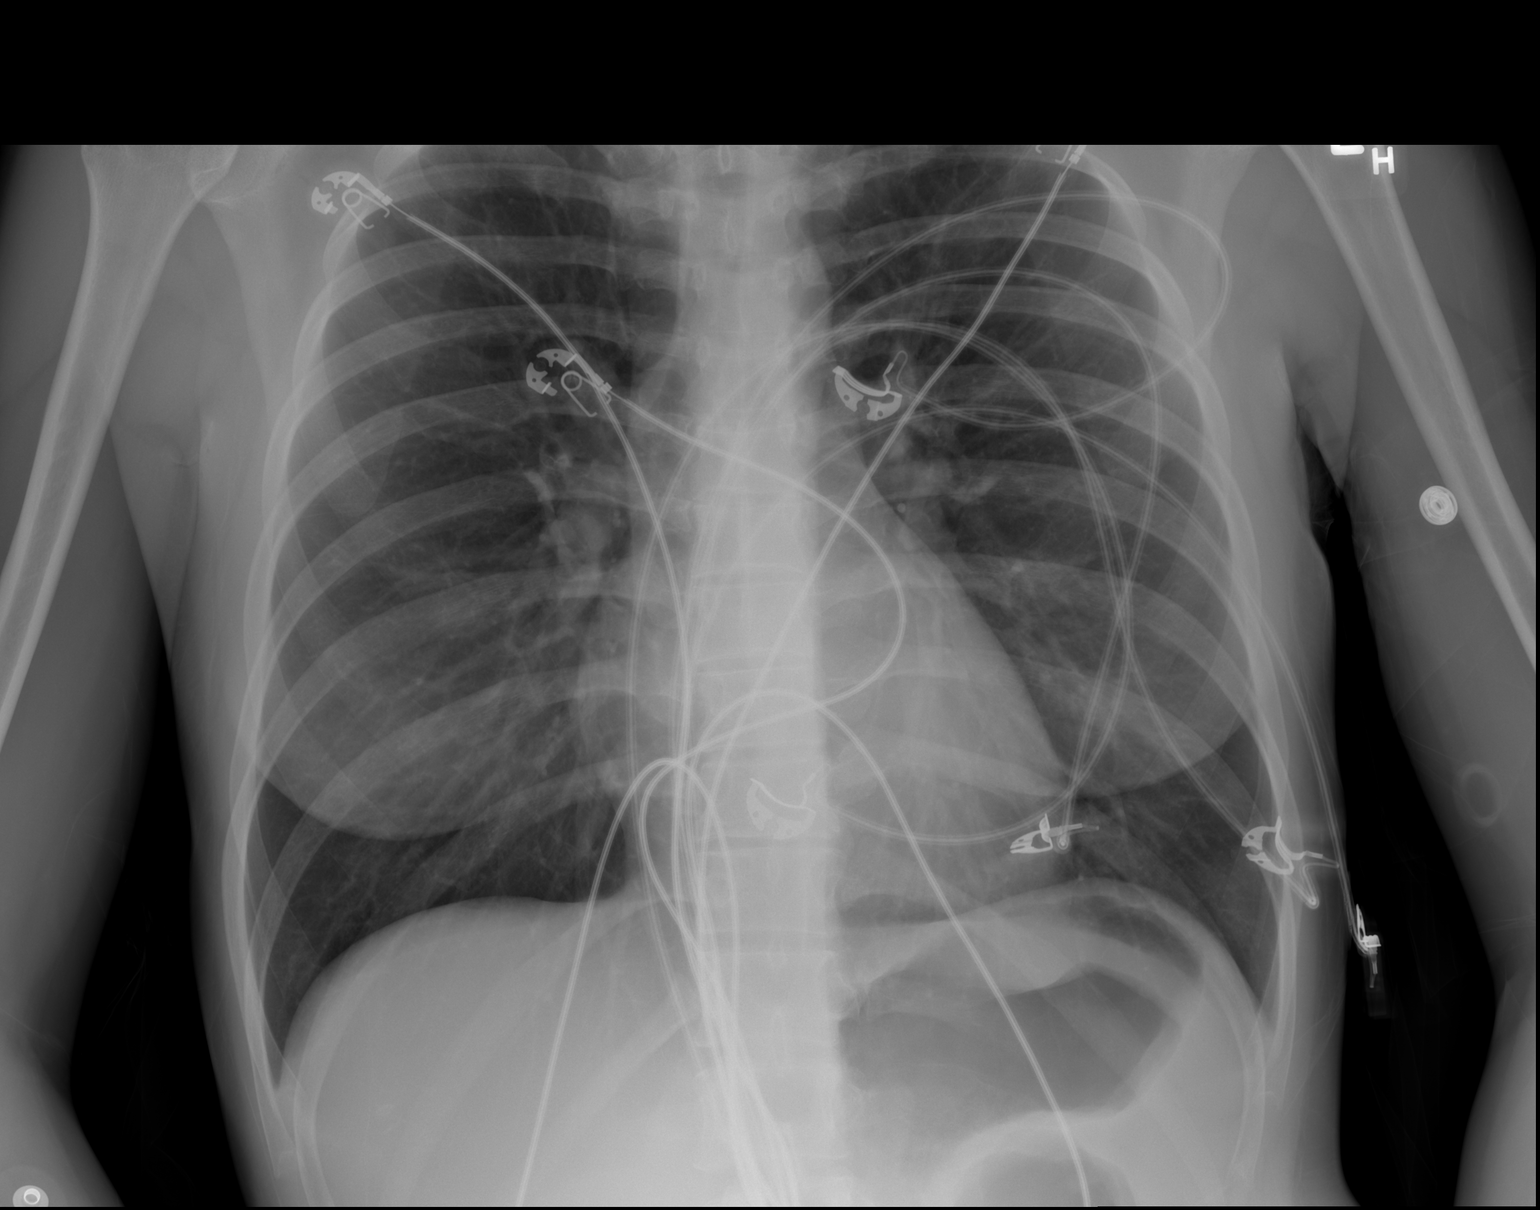

[2 of 2 positions shown; findings below may reference images not displayed]

FINDINGS: The heart size and mediastinal contours are within normal limits.
Both lungs are clear. The visualized skeletal structures are
unremarkable.
IMPRESSION: No active cardiopulmonary disease.

## 2016-06-15 ENCOUNTER — Emergency Department (HOSPITAL_COMMUNITY)
Admission: EM | Admit: 2016-06-15 | Discharge: 2016-06-15 | Disposition: A | Discharge status: Home / Self Care | Payer: Medicaid Other | Attending: Emergency Medicine | Admitting: Emergency Medicine

## 2016-06-15 ENCOUNTER — Encounter (HOSPITAL_COMMUNITY): Payer: Self-pay | Admitting: Nurse Practitioner

## 2016-06-15 DIAGNOSIS — Z331 Pregnant state, incidental: Secondary | ICD-10-CM | POA: Diagnosis not present

## 2016-06-15 DIAGNOSIS — R109 Unspecified abdominal pain: Secondary | ICD-10-CM | POA: Diagnosis present

## 2016-06-15 DIAGNOSIS — Z3490 Encounter for supervision of normal pregnancy, unspecified, unspecified trimester: Secondary | ICD-10-CM

## 2016-06-15 DIAGNOSIS — F17210 Nicotine dependence, cigarettes, uncomplicated: Secondary | ICD-10-CM | POA: Diagnosis not present

## 2016-06-15 DIAGNOSIS — R102 Pelvic and perineal pain: Secondary | ICD-10-CM | POA: Diagnosis not present

## 2016-06-15 LAB — COMPREHENSIVE METABOLIC PANEL
ALK PHOS: 84 U/L (ref 38–126)
ALT: 35 U/L (ref 14–54)
AST: 41 U/L (ref 15–41)
Albumin: 3.6 g/dL (ref 3.5–5.0)
Anion gap: 8 (ref 5–15)
CALCIUM: 9.3 mg/dL (ref 8.9–10.3)
CO2: 24 mmol/L (ref 22–32)
CREATININE: 0.37 mg/dL — AB (ref 0.44–1.00)
Chloride: 104 mmol/L (ref 101–111)
GFR calc non Af Amer: >60 mL/min (ref >60)
GLUCOSE: 69 mg/dL (ref 65–99)
Potassium: 3.9 mmol/L (ref 3.5–5.1)
SODIUM: 136 mmol/L (ref 135–145)
Total Bilirubin: 0.7 mg/dL (ref 0.3–1.2)
Total Protein: 8 g/dL (ref 6.5–8.1)

## 2016-06-15 LAB — URINALYSIS, ROUTINE W REFLEX MICROSCOPIC
BILIRUBIN URINE: NEGATIVE
GLUCOSE, UA: NEGATIVE mg/dL
HGB URINE DIPSTICK: NEGATIVE
Ketones, ur: NEGATIVE mg/dL
Nitrite: NEGATIVE
PROTEIN: NEGATIVE mg/dL
Specific Gravity, Urine: 1.022 (ref 1.005–1.030)
pH: 6.5 (ref 5.0–8.0)

## 2016-06-15 LAB — I-STAT BETA HCG BLOOD, ED (MC, WL, AP ONLY)

## 2016-06-15 LAB — CBC
HCT: 41.8 % (ref 36.0–46.0)
Hemoglobin: 13.5 g/dL (ref 12.0–15.0)
MCH: 28.4 pg (ref 26.0–34.0)
MCHC: 32.3 g/dL (ref 30.0–36.0)
MCV: 87.8 fL (ref 78.0–100.0)
PLATELETS: 353 10*3/uL (ref 150–400)
RBC: 4.76 MIL/uL (ref 3.87–5.11)
RDW: 12.2 % (ref 11.5–15.5)
WBC: 7.2 10*3/uL (ref 4.0–10.5)

## 2016-06-15 LAB — LIPASE, BLOOD: Lipase: 22 U/L (ref 11–51)

## 2016-06-15 LAB — URINE MICROSCOPIC-ADD ON: RBC / HPF: NONE SEEN RBC/hpf (ref 0–5)

## 2016-06-15 MED ORDER — NITROFURANTOIN MONOHYD MACRO 100 MG PO CAPS: 100.0000 mg | ORAL_CAPSULE | Freq: Two times a day (BID) | ORAL | Status: DC

## 2016-06-15 MED ORDER — CEPHALEXIN 500 MG PO CAPS: 500.0000 mg | ORAL_CAPSULE | Freq: Two times a day (BID) | ORAL | Status: AC

## 2016-06-15 NOTE — Discharge Instructions (Signed)
Please follow-up closely with OB regarding your prenatal care. [facility] information listed above.  Return without fail for worsening symptoms, including worsening pain, vaginal bleeding, fever or any other symptoms concerning to you.  You have bacteria in your urine, and with your frequency of urine will treat for potential UTI. Take antibiotics as prescribed.  Second Trimester of Pregnancy The second trimester is from week 13 through week 28, months 4 through 6. The second trimester is often a time when you feel your best. Your body has also adjusted to being pregnant, and you begin to feel better physically. Usually, morning sickness has lessened or quit completely, you may have more energy, and you may have an increase in appetite. The second trimester is also a time when the fetus is growing rapidly. At the end of the sixth month, the fetus is about 9 inches long and weighs about 1 pounds. You will likely begin to feel the baby move (quickening) between 18 and 20 weeks of the pregnancy. BODY CHANGES Your body goes through many changes during pregnancy. The changes vary from woman to woman.   Your weight will continue to increase. You will notice your lower abdomen bulging out.  You may begin to get stretch marks on your hips, abdomen, and breasts.  You may develop headaches that can be relieved by medicines approved by your health care provider.  You may urinate more often because the fetus is pressing on your bladder.  You may develop or continue to have heartburn as a result of your pregnancy.  You may develop constipation because certain hormones are causing the muscles that push waste through your intestines to slow down.  You may develop hemorrhoids or swollen, bulging veins (varicose veins).  You may have back pain because of the weight gain and pregnancy hormones relaxing your joints between the bones in your pelvis and as a result of a shift in weight and the muscles that  support your balance.  Your breasts will continue to grow and be tender.  Your gums may bleed and may be sensitive to brushing and flossing.  Dark spots or blotches (chloasma, mask of pregnancy) may develop on your face. This will likely fade after the baby is born.  A dark line from your belly button to the pubic area (linea nigra) may appear. This will likely fade after the baby is born.  You may have changes in your hair. These can include thickening of your hair, rapid growth, and changes in texture. Some women also have hair loss during or after pregnancy, or hair that feels dry or thin. Your hair will most likely return to normal after your baby is born. WHAT TO EXPECT AT YOUR PRENATAL VISITS During a routine prenatal visit:  You will be weighed to make sure you and the fetus are growing normally.  Your blood pressure will be taken.  Your abdomen will be measured to track your baby's growth.  The fetal heartbeat will be listened to.  Any test results from the previous visit will be discussed. Your health care provider may ask you:  How you are feeling.  If you are feeling the baby move.  If you have had any abnormal symptoms, such as leaking fluid, bleeding, severe headaches, or abdominal cramping.  If you are using any tobacco products, including cigarettes, chewing tobacco, and electronic cigarettes.  If you have any questions. Other tests that may be performed during your second trimester include:  Blood tests that check for:  Low iron levels (anemia).  Gestational diabetes (between 24 and 28 weeks).  Rh antibodies.  Urine tests to check for infections, diabetes, or protein in the urine.  An ultrasound to confirm the proper growth and development of the baby.  An amniocentesis to check for possible genetic problems.  Fetal screens for spina bifida and Down syndrome.  HIV (human immunodeficiency virus) testing. Routine prenatal testing includes screening  for HIV, unless you choose not to have this test. HOME CARE INSTRUCTIONS   Avoid all smoking, herbs, alcohol, and unprescribed drugs. These chemicals affect the formation and growth of the baby.  Do not use any tobacco products, including cigarettes, chewing tobacco, and electronic cigarettes. If you need help quitting, ask your health care provider. You may receive counseling support and other resources to help you quit.  Follow your health care provider's instructions regarding medicine use. There are medicines that are either safe or unsafe to take during pregnancy.  Exercise only as directed by your health care provider. Experiencing uterine cramps is a good sign to stop exercising.  Continue to eat regular, healthy meals.  Wear a good support bra for breast tenderness.  Do not use hot tubs, steam rooms, or saunas.  Wear your seat belt at all times when driving.  Avoid raw meat, uncooked cheese, cat litter boxes, and soil used by cats. These carry germs that can cause birth defects in the baby.  Take your prenatal vitamins.  Take 1500-2000 mg of calcium daily starting at the 20th week of pregnancy until you deliver your baby.  Try taking a stool softener (if your health care provider approves) if you develop constipation. Eat more high-fiber foods, such as fresh vegetables or fruit and whole grains. Drink plenty of fluids to keep your urine clear or pale yellow.  Take warm sitz baths to soothe any pain or discomfort caused by hemorrhoids. Use hemorrhoid cream if your health care provider approves.  If you develop varicose veins, wear support hose. Elevate your feet for 15 minutes, 3-4 times a day. Limit salt in your diet.  Avoid heavy lifting, wear low heel shoes, and practice good posture.  Rest with your legs elevated if you have leg cramps or low back pain.  Visit your dentist if you have not gone yet during your pregnancy. Use a soft toothbrush to brush your teeth and be  gentle when you floss.  A sexual relationship may be continued unless your health care provider directs you otherwise.  Continue to go to all your prenatal visits as directed by your health care provider. SEEK MEDICAL CARE IF:   You have dizziness.  You have mild pelvic cramps, pelvic pressure, or nagging pain in the abdominal area.  You have persistent nausea, vomiting, or diarrhea.  You have a bad smelling vaginal discharge.  You have pain with urination. SEEK IMMEDIATE MEDICAL CARE IF:   You have a fever.  You are leaking fluid from your vagina.  You have spotting or bleeding from your vagina.  You have severe abdominal cramping or pain.  You have rapid weight gain or loss.  You have shortness of breath with chest pain.  You notice sudden or extreme swelling of your face, hands, ankles, feet, or legs.  You have not felt your baby move in over an hour.  You have severe headaches that do not go away with medicine.  You have vision changes.   This information is not intended to replace advice given to you by your  health care provider. Make sure you discuss any questions you have with your health care provider.   Document Released: 11/15/2001 Document Revised: 12/12/2014 Document Reviewed: 01/22/2013 Elsevier Interactive Patient Education [employer]

## 2016-06-15 NOTE — ED Provider Notes (Signed)
CSN: [id number]     Arrival date & time 06/15/16  1407 History   First MD Initiated Contact with Patient 06/15/16 1631     Chief Complaint  Patient presents with  . Abdominal Pain     (Consider location/radiation/quality/duration/timing/severity/associated sxs/prior Treatment) HPI 25 year old female who presents with intermittent abdominal pain for 1 week. She has a history of anxiety and depression. States that for the past 1-2 month she has had gradual increased abdominal girth and over the past week she has had intermittent nausea and vomiting in the mornings and evenings. Has also had some intermittent pelvic pressure and pain. No fevers, chills, abnormal vaginal bleeding or discharge. No urinary complaints. Unsure of last LMP   Past Medical History  Diagnosis Date  . Anxiety   . Depression   . Headache(784.0)   . Substance abuse    History reviewed. No pertinent past surgical history. Family History  Problem Relation Age of Onset  . Alcohol abuse Father   . Drug abuse Father   . Depression Father   . Anxiety disorder Father    Social History  Substance Use Topics  . Smoking status: Current Every Day Smoker -- 2.00 packs/day for 4 years    Types: Cigarettes  . Smokeless tobacco: None  . Alcohol Use: No     Comment: denies   OB History    No data available     Review of Systems 10/14 systems reviewed and are negative other than those stated in the HPI    Allergies  Review of patient's allergies indicates no known allergies.  Home Medications   Prior to Admission medications   Medication Sig Start Date End Date Taking? Authorizing Provider  cephALEXin (KEFLEX) 500 MG capsule Take 1 capsule (500 mg total) by mouth 2 (two) times daily. 06/15/16   Lavera Guiseana Duo [place]ale Wherry, MD  PRESCRIPTION MEDICATION Take 1 tablet by mouth daily. Birth Control.    Historical Provider, MD   BP 108/79 mmHg  Pulse 87  Temp(Src) 98.6 F (37 C) (Oral)  Resp 26  SpO2 100% Physical  Exam Physical Exam  Nursing note and vitals reviewed. Constitutional: Well developed, well nourished, non-toxic, and in no acute distress Head: Normocephalic and atraumatic.  Mouth/Throat: Oropharynx is clear and moist.  Neck: Normal range of motion. Neck supple.  Cardiovascular: Normal rate and regular rhythm.   Pulmonary/Chest: Effort normal and breath sounds normal.  Abdominal: Soft. Gravid. There is minimal suprapubic tenderness. There is no rebound and no guarding.  Musculoskeletal: Normal range of motion.  Neurological: Alert, no facial droop, fluent speech, moves all extremities symmetrically Skin: Skin is warm and dry.  Psychiatric: Cooperative  ED Course  Procedures (including critical care time) Labs Review Labs Reviewed  COMPREHENSIVE METABOLIC PANEL - Abnormal; Notable for the following:    BUN <5 (*)    Creatinine, Ser 0.37 (*)    All other components within normal limits  URINALYSIS, ROUTINE W REFLEX MICROSCOPIC (NOT AT [facility]) - Abnormal; Notable for the following:    Leukocytes, UA MODERATE (*)    All other components within normal limits  URINE MICROSCOPIC-ADD ON - Abnormal; Notable for the following:    Squamous Epithelial / LPF 6-30 (*)    Bacteria, UA MANY (*)    All other components within normal limits  I-STAT BETA HCG BLOOD, ED ([facility], [facility], [facility] ONLY) - Abnormal; Notable for the following:    I-stat hCG, quantitative >2000.0 (*)    All other components within normal limits  URINE CULTURE  CBC  LIPASE, BLOOD    Imaging Review No results found. I have personally reviewed and evaluated these images and lab results as part of my medical decision-making.   EKG Interpretation None     EMERGENCY DEPARTMENT [place] PREGNANCY "Study: Limited Ultrasound of the Pelvis"  INDICATIONS:Pregnancy(required) Multiple views of the uterus and pelvic cavity are obtained with a multi-frequency probe.  APPROACH:Transabdominal   PERFORMED BY: Myself  IMAGES ARCHIVED?:  Yes  LIMITATIONS: Decompressed bladder  PREGNANCY FREE FLUID: None  PREGNANCY UTERUS FINDINGS:Uterus enlarged and Gestational sac noted ADNEXAL FINDINGS: N/A  PREGNANCY FINDINGS: IUP, fetus with active fetal movement, FHR 130  INTERPRETATION: Viable intrauterine pregnancy  GESTATIONAL AGE, ESTIMATE: N/A  FETAL HEART RATE: 130   COMMENT(Estimate of Gestational Age):  N/A    MDM   Final diagnoses:  Intrauterine pregnancy, incidental    Positive pregnancy test in ED with bedside transabdominal US showing IUP with FHR at 130s. Her abdomen is overall benign. No vaginal bleeding. She requests that pelvic exam not be performed today, and expressed preference to have this performed by her OB physician when she follows up. Given referral to [facility].   UA with bacterial and moderate leukocytes. With frequency of urine and suprapubic pressure, will treat for UTI. No concern for pyelonephritis at this time. Culture sent.  Strict return and follow-up instructions reviewed. She expressed understanding of all discharge instructions and felt comfortable with the plan of care.     Lavera Guise, MD 06/15/16 [phone]

## 2016-06-15 NOTE — ED Notes (Signed)
She c/o 1-2 month history of abd pain and swelling. She tried acidophilus which seemed to regulate her bowels but the pain and swelling continue. Visible abd swelling today. She reports n/v. She denies fevers. She reports pain is intermittent but severe when it comes. She is alert, breathing easily

## 2016-06-17 LAB — URINE CULTURE

## 2016-12-13 ENCOUNTER — Emergency Department (HOSPITAL_COMMUNITY)
Admission: EM | Admit: 2016-12-13 | Discharge: 2016-12-13 | Disposition: A | Discharge status: Home / Self Care | Payer: Medicaid Other | Attending: Emergency Medicine | Admitting: Emergency Medicine

## 2016-12-13 ENCOUNTER — Encounter (HOSPITAL_COMMUNITY): Payer: Self-pay | Admitting: Emergency Medicine

## 2016-12-13 DIAGNOSIS — R05 Cough: Secondary | ICD-10-CM | POA: Diagnosis not present

## 2016-12-13 DIAGNOSIS — Z5321 Procedure and treatment not carried out due to patient leaving prior to being seen by health care provider: Secondary | ICD-10-CM | POA: Diagnosis not present

## 2016-12-13 NOTE — ED Triage Notes (Signed)
Pt sts cough and URI sx 1 week

## 2017-08-13 ENCOUNTER — Encounter (HOSPITAL_COMMUNITY): Payer: Self-pay | Admitting: Emergency Medicine

## 2017-08-13 ENCOUNTER — Emergency Department (HOSPITAL_COMMUNITY)
Admission: EM | Admit: 2017-08-13 | Discharge: 2017-08-13 | Disposition: A | Discharge status: Home / Self Care | Payer: Medicaid Other | Attending: Emergency Medicine | Admitting: Emergency Medicine

## 2017-08-13 DIAGNOSIS — J069 Acute upper respiratory infection, unspecified: Secondary | ICD-10-CM | POA: Diagnosis not present

## 2017-08-13 DIAGNOSIS — J029 Acute pharyngitis, unspecified: Secondary | ICD-10-CM | POA: Diagnosis not present

## 2017-08-13 DIAGNOSIS — F17210 Nicotine dependence, cigarettes, uncomplicated: Secondary | ICD-10-CM | POA: Diagnosis not present

## 2017-08-13 DIAGNOSIS — B9789 Other viral agents as the cause of diseases classified elsewhere: Secondary | ICD-10-CM | POA: Diagnosis not present

## 2017-08-13 DIAGNOSIS — K029 Dental caries, unspecified: Secondary | ICD-10-CM | POA: Diagnosis not present

## 2017-08-13 DIAGNOSIS — R05 Cough: Secondary | ICD-10-CM | POA: Diagnosis present

## 2017-08-13 LAB — RAPID STREP SCREEN (MED CTR MEBANE ONLY): Streptococcus, Group A Screen (Direct): NEGATIVE

## 2017-08-13 MED ORDER — AMOXICILLIN 500 MG PO CAPS: 500.0000 mg | ORAL_CAPSULE | Freq: Three times a day (TID) | ORAL | 0 refills | Status: AC

## 2017-08-13 MED ORDER — IBUPROFEN 600 MG PO TABS: 600.0000 mg | ORAL_TABLET | Freq: Four times a day (QID) | ORAL | 0 refills | Status: AC | PRN

## 2017-08-13 NOTE — ED Triage Notes (Signed)
Pt reports 1 week of left lower tooth ache. Pt also states sore throat with cough, greenish yellowish mucous production. States a 101 fever at home. Vitals stable, afebrile at triage

## 2017-08-13 NOTE — ED Provider Notes (Signed)
[facility]-EM[facility]ENCY DEPT Provider Note   CSN: [id number] Arrival date & time: 08/13/17  1641     History   Chief Complaint Chief Complaint  Patient presents with  . Cough  . Sore Throat  . Dental Pain    HPI [name] is a 26 y.o. female.  HPI   26 year old female with history of anxiety, substance abuse presenting for evaluation of dental pain and cold symptoms. Patient states a week ago she developed gradual onset of sharp throbbing pain to her left lower back tooth. Pain is been persistent, worse with chewing and with temperature changes. For the past several days she also report having nasal congestion, sneezing, cough with productive yellow sputum, and a fever of 101 yesterday. She tries Tylenol at home with minimal improvement. She denies nausea vomiting and diarrhea, or rash.  Past Medical History:  Diagnosis Date  . Anxiety   . Depression   . Headache(784.0)   . Substance abuse     Patient Active Problem List   Diagnosis Date Noted  . Heroin use 12/10/2011    No past surgical history on file.  OB History    No data available       Home Medications    Prior to Admission medications   Medication Sig Start Date End Date Taking? Authorizing Provider  cephALEXin (KEFLEX) 500 MG capsule Take 1 capsule (500 mg total) by mouth 2 (two) times daily. 06/15/16   Lavera Guise, MD  PRESCRIPTION MEDICATION Take 1 tablet by mouth daily. Birth Control.    [provider]    Family History Family History  Problem Relation Age of Onset  . Alcohol abuse Father   . Drug abuse Father   . Depression Father   . Anxiety disorder Father     Social History Social History  Substance Use Topics  . Smoking status: Current Every Day Smoker    Packs/day: 2.00    Years: 4.00    Types: Cigarettes  . Smokeless tobacco: Not on file  . Alcohol use No     Comment: denies     Allergies   Patient has no known allergies.   Review of Systems Review of Systems    All other systems reviewed and are negative.    Physical Exam Updated Vital Signs BP 112/81   Pulse 70   Temp 98.3 F (36.8 C) (Oral)   Resp 18   SpO2 96%   Physical Exam  Constitutional: She appears well-developed and well-nourished. No distress.  HENT:  Head: Atraumatic.  Ears: Normal TMs bilaterally Nose: Normal nares Mouth: Poor dentition with widespread dental decay. Tenderness noted to tooth #18 with significant dental decay but no gingival erythema or abscess noted. Uvula is midline no tonsillar enlargement or exudates. No trismus   Eyes: Conjunctivae are normal.  Neck: Normal range of motion. Neck supple.  No nuchal rigidity  Cardiovascular: Normal rate and regular rhythm.   Pulmonary/Chest: Effort normal and breath sounds normal.  Abdominal: Soft. She exhibits no distension. There is no tenderness.  Lymphadenopathy:    She has no cervical adenopathy.  Neurological: She is alert.  Skin: No rash noted.  Psychiatric: She has a normal mood and affect.  Nursing note and vitals reviewed.    ED Treatments / Results  Labs (all labs ordered are listed, but only abnormal results are displayed) Labs Reviewed  RAPID STREP SCREEN (NOT AT [facility])    EKG  EKG Interpretation None       Radiology  No results found.  Procedures Procedures (including critical care time)  Medications Ordered in ED Medications - No data to display   Initial Impression / Assessment and Plan / ED Course  I have reviewed the triage vital signs and the nursing notes.  Pertinent labs & imaging results that were available during my care of the patient were reviewed by me and considered in my medical decision making (see chart for details).     BP 112/81   Pulse 70   Temp 98.3 F (36.8 C) (Oral)   Resp 18   SpO2 96%    Final Clinical Impressions(s) / ED Diagnoses   Final diagnoses:  Pain due to dental caries  Viral URI with cough    New Prescriptions New Prescriptions    AMOXICILLIN (AMOXIL) 500 MG CAPSULE    Take 1 capsule (500 mg total) by mouth 3 (three) times daily.   IBUPROFEN (ADVIL,MOTRIN) 600 MG TABLET    Take 1 tablet (600 mg total) by mouth every 6 (six) hours as needed.   5:32 PM Patient here with dental pain and cold symptoms. She is well-appearing, afebrile, no hypoxia. Lungs sounds are clear. Plan to provide amoxicillin to treat for dental pain as well as potential pneumonia, ibuprofen for pain, dental referral given.   Fayrene Helperran, Sheli Dorin, PA-C 08/13/17 1734    Raeford RazorKohut, Stephen, MD 08/14/17 1155

## 2017-08-16 LAB — CULTURE, GROUP A STREP (THRC)

## 2017-10-08 ENCOUNTER — Emergency Department (HOSPITAL_COMMUNITY): Payer: Medicaid Other

## 2017-10-08 ENCOUNTER — Emergency Department (HOSPITAL_COMMUNITY)
Admission: EM | Admit: 2017-10-08 | Discharge: 2017-10-08 | Disposition: A | Discharge status: Home / Self Care | Payer: Medicaid Other | Attending: Emergency Medicine | Admitting: Emergency Medicine

## 2017-10-08 ENCOUNTER — Encounter (HOSPITAL_COMMUNITY): Payer: Self-pay | Admitting: Emergency Medicine

## 2017-10-08 DIAGNOSIS — J069 Acute upper respiratory infection, unspecified: Secondary | ICD-10-CM | POA: Insufficient documentation

## 2017-10-08 DIAGNOSIS — B9789 Other viral agents as the cause of diseases classified elsewhere: Secondary | ICD-10-CM | POA: Diagnosis not present

## 2017-10-08 DIAGNOSIS — Z79899 Other long term (current) drug therapy: Secondary | ICD-10-CM | POA: Insufficient documentation

## 2017-10-08 DIAGNOSIS — F17210 Nicotine dependence, cigarettes, uncomplicated: Secondary | ICD-10-CM | POA: Insufficient documentation

## 2017-10-08 DIAGNOSIS — R070 Pain in throat: Secondary | ICD-10-CM | POA: Diagnosis present

## 2017-10-08 DIAGNOSIS — J029 Acute pharyngitis, unspecified: Secondary | ICD-10-CM | POA: Insufficient documentation

## 2017-10-08 LAB — RAPID STREP SCREEN (MED CTR MEBANE ONLY): Streptococcus, Group A Screen (Direct): NEGATIVE

## 2017-10-08 NOTE — ED Triage Notes (Signed)
Pt. Stated, I've had sore throat for 4 days with congestion

## 2017-10-08 NOTE — Discharge Instructions (Addendum)
Your strep test was negative. The chest xray showed no pneumonia and was normal.   Your symptoms are consistent with a viral upper respiratory tract infection. Antibiotics are not used for this type of infection.   Please take over the counter decongestant like sudafed to help with congestion.  Take Tylenol every 4 hours as directed for pain please use over-the-counter throat lozenges, warm liquids, salt water gargles to help with sore throat pain.   Return to the ER if you have difficulty breathing or any new or worsening symptoms.  She the health department if not better in a week.  Ask your doctor at the health department to help you to stop smoking

## 2017-10-08 NOTE — ED Notes (Signed)
Declined W/C at D/C and was escorted to lobby by RN. 

## 2017-10-08 NOTE — ED Provider Notes (Signed)
Complains of sore throat and cough onset 4 days ago.  Denies any shortness of breath.  Maximum temperature 100.2.  No other complaint.  Other associated symptoms include hoarseness treated with Tylenol with adequate relief.  On exam no distress handling secretions well oropharynx is minimally reddened.  Voice is slightly somewhat hoarse.  Neck is supple lungs clear to auscultation heart regular rate and rhythm. Symptoms consistent with viral respiratory illness.  Chest x-ray reviewed by me.  I counseled patient for 5 minutes on smoking cessation.  Plan Tylenol, follow-up with health department as needed not better in a week.    I counseled patient for 5 minutes on smoking cessation Results for orders placed or performed during the hospital encounter of 10/08/17  Rapid strep screen  Result Value Ref Range   Streptococcus, Group A Screen (Direct) NEGATIVE NEGATIVE   Dg Chest 2 View  Result Date: 10/08/2017 CLINICAL DATA:  Pt reports mid chest pain, sore throat, and productive cough w/ yellow/green mucus x 4 days; reports fever on day 1 but has since gone away; smoker EXAM: CHEST  2 VIEW COMPARISON:  08/16/2014 FINDINGS: Cardiac silhouette is normal in size. Normal mediastinal and hilar contours. Mild linear opacities noted in the anterior mid lung on the lateral view consistent with subsegmental atelectasis, new since the prior study. Lungs are otherwise clear. No pleural effusion or pneumothorax. Skeletal structures are unremarkable. IMPRESSION: No active cardiopulmonary disease. Electronically Signed   By: Amie Portlandavid  Ormond M.D.   On: 10/08/2017 17:29     Doug SouJacubowitz, Keshana Klemz, MD 10/08/17 1807

## 2017-10-08 NOTE — ED Provider Notes (Signed)
[facility] Provider Note   CSN: [id number] Arrival date & time: 10/08/17  1313     History   Chief Complaint Chief Complaint  Patient presents with  . Sore Throat  . Nasal Congestion    HPI [name] is a 26 y.o. female.  HPI   [name] is a 26 year old female with a history of heroin use, anxiety, depression who presents the emergency department for evaluation of cough, congestion and sore throat.  Patient states that her symptoms began 4 days ago.  States that she has had worsening throat pain which is 7/10 in severity, "scratchy and dry" in nature and worsened with swallowing.  Sore throat improved with warm liquids.  She also endorses productive cough with green phlegm and also nasal congestion with green nasal secretions.  States that she has some dull chest pain when she coughs.  She endorses a fever of 100.1 at home 3 days ago.  She currently denies chest pain, shortness of breath, ear pain, trismus, dysphagia.  She states that her 26-year-old son is also sick and was recently diagnosed with strep throat.  Past Medical History:  Diagnosis Date  . Anxiety   . Depression   . Headache(784.0)   . Substance abuse [facility](HCC)     Patient Active Problem List   Diagnosis Date Noted  . Heroin use 12/10/2011    History reviewed. No pertinent surgical history.  OB History    No data available       Home Medications    Prior to Admission medications   Medication Sig Start Date End Date Taking? Authorizing Provider  amoxicillin (AMOXIL) 500 MG capsule Take 1 capsule (500 mg total) by mouth 3 (three) times daily. 08/13/17   Fayrene Helperran, Bowie, PA-C  cephALEXin (KEFLEX) 500 MG capsule Take 1 capsule (500 mg total) by mouth 2 (two) times daily. 06/15/16   Lavera GuiseLiu, Dana Duo, MD  ibuprofen (ADVIL,MOTRIN) 600 MG tablet Take 1 tablet (600 mg total) by mouth every 6 (six) hours as needed. 08/13/17   Fayrene Helperran, Bowie, PA-C  PRESCRIPTION MEDICATION Take 1  tablet by mouth daily. Birth Control.    [provider]    Family History Family History  Problem Relation Age of Onset  . Alcohol abuse Father   . Drug abuse Father   . Depression Father   . Anxiety disorder Father     Social History Social History   Tobacco Use  . Smoking status: Current Every Day Smoker    Packs/day: 2.00    Years: 4.00    Pack years: 8.00    Types: Cigarettes  . Smokeless tobacco: Current User  Substance Use Topics  . Alcohol use: No    Comment: denies  . Drug use: Yes    Types: Oxycodone, Methamphetamines, Cocaine, Heroin, Other-see comments    Comment: former     Allergies   Patient has no known allergies.   Review of Systems Review of Systems  Constitutional: Positive for fever. Negative for chills and fatigue.  HENT: Positive for congestion, sore throat and voice change (hoarse voice). Negative for ear discharge, ear pain, facial swelling, sinus pressure, sinus pain and trouble swallowing.   Respiratory: Positive for cough. Negative for shortness of breath and wheezing.   Cardiovascular: Positive for chest pain. Negative for palpitations.  Gastrointestinal: Negative for nausea and vomiting.     Physical Exam Updated Vital Signs BP 112/69 (BP Location: Right Arm)   Pulse 69   Temp 98.4  F (36.9 C) (Oral)   Resp 17   Ht 5\' 3"  (1.6 m)   Wt 68 kg (150 lb)   LMP 07/08/2017   SpO2 99%   BMI 26.57 kg/m   Physical Exam  Constitutional: She appears well-developed and well-nourished. No distress.  Patient is calm, nontoxic-appearing and in no acute distress.  HENT:  Head: Normocephalic and atraumatic.  Right Ear: Tympanic membrane and ear canal normal. No tenderness.  Left Ear: Tympanic membrane and ear canal normal. No tenderness.  Mucous membranes moist.  Able to handle oral secretions.  Posterior oropharynx mildly erythematous, no tonsillar exudate or swelling.  Uvula midline.  No trismus. Voice mildly hoarse.   Eyes:  Pupils are equal, round, and reactive to light. Right eye exhibits no discharge. Left eye exhibits no discharge.  Neck: Normal range of motion. Neck supple.  Cardiovascular: Normal rate, regular rhythm and intact distal pulses.  No murmur heard. Pulmonary/Chest: Effort normal and breath sounds normal. No respiratory distress. She has no wheezes. She has no rhonchi. She has no rales.  Lymphadenopathy:    She has cervical adenopathy (anterior chain).  Neurological: She is alert. Coordination normal.  Skin: She is not diaphoretic.  Psychiatric: She has a normal mood and affect. Her behavior is normal.  Nursing note and vitals reviewed.    ED Treatments / Results  Labs (all labs ordered are listed, but only abnormal results are displayed) Labs Reviewed  RAPID STREP SCREEN (NOT AT [facility])  CULTURE, GROUP A STREP [facility])    EKG  EKG Interpretation None       Radiology No results found.  Procedures Procedures (including critical care time)  Medications Ordered in ED Medications - No data to display   Initial Impression / Assessment and Plan / ED Course  I have reviewed the triage vital signs and the nursing notes.  Pertinent labs & imaging results that were available during my care of the patient were reviewed by me and considered in my medical decision making (see chart for details).     Presents the emergency department with sore throat, congestion and productive cough for the past 4 days.  Rapid strep test negative. Chest x-ray ordered given history of productive cough and fever at home.  Chest Xray normal, no consolidation concerning for pneumonia.   Her symptoms are consistent with viral upper respiratory tract infection.  No abx indicated. Discharged with symptomatic tx for pain.  Pt does not appear dehydrated, but did discuss importance of water rehydration. Presentation non concerning for PTA or RPA. No trismus or uvula deviation. Specific return precautions discussed.  Pt able to drink water in ED without difficulty with intact air way. Recommended follow up at health department if symptoms are not improving.  Patient agrees and voiced understanding to plan.    Final Clinical Impressions(s) / ED Diagnoses   Final diagnoses:  None    New Prescriptions This SmartLink is deprecated. Use AVSMEDLIST instead to display the medication list for a patient.   Kellie Shropshire, PA-C 10/09/17 1120    Doug Sou, MD 10/09/17 1408

## 2017-10-10 LAB — CULTURE, GROUP A STREP (THRC)

## 2017-10-11 ENCOUNTER — Telehealth: Payer: Self-pay | Admitting: *Deleted

## 2017-10-11 NOTE — Progress Notes (Signed)
ED Antimicrobial Stewardship Positive Culture Follow Up   Galen DaftChristine Dillenburg is an 26 y.o. female who [facility] with a chief complaint of  Chief Complaint  Patient presents with  . Sore Throat  . Nasal Congestion    Recent Results (from the past 720 hour(s))  Rapid strep screen     Status: None   Collection Time: 10/08/17  1:48 PM  Result Value Ref Range Status   Streptococcus, Group A Screen (Direct) NEGATIVE NEGATIVE Final    Comment: (NOTE) A Rapid Antigen test may result negative if the antigen level in the sample is below the detection level of this test. The FDA has not cleared this test as a stand-alone test therefore the rapid antigen negative result has reflexed to a Group A Strep culture.   Culture, group A strep     Status: None   Collection Time: 10/08/17  1:48 PM  Result Value Ref Range Status   Specimen Description THROAT  Final   Special Requests NONE Reflexed from [id number]  Final   Culture FEW GROUP A STREP (S.PYOGENES) ISOLATED  Final   Report Status 10/10/2017 FINAL  Final   Rapid strep test negative, Group A strep culture positive. [x]  Patient discharged originally without antimicrobial agent and treatment is now indicated  New antibiotic prescription: penicillin VK 500 mg PO BID x 10 days  ED Provider: Cathie OldenLindsey Layden   Terryl Molinelli N Tresten Pantoja 10/11/2017, 9:18 AM Infectious Diseases Pharmacist Phone# [phone]

## 2017-10-11 NOTE — Telephone Encounter (Signed)
Post ED Visit - Positive Culture Follow-up: Unsuccessful Patient Follow-up  Culture assessed and recommendations reviewed by:  []  Enzo BiNathan Batchelder, Pharm.D. []  Celedonio MiyamotoJeremy Frens, Pharm.D., BCPS AQ-ID []  Garvin FilaMike Maccia, Pharm.D., BCPS []  Georgina PillionElizabeth Martin, Pharm.D., BCPS []  [place]ter[place]h Pham, [address] Rainbow BoulevardPharm.D., BCPS, AAHIVP []  Estella HuskMichelle Turner, Pharm.D., BCPS, AAHIVP []  Lysle Pearlachel Rumbarger, PharmD, BCPS []  Casilda Carlsaylor Stone, PharmD, BCPS []  Pollyann SamplesAndy Johnston, PharmD, BCPS  Positive strep culture, reviewed by Graciella FreerLindsey Layden, PA-C  [x]  Patient discharged without antimicrobial prescription and treatment is now indicated []  Organism is resistant to prescribed ED discharge antimicrobial []  Patient with positive blood cultures   Unable to contact patient after 3 attempts, letter will be sent to address on file  Lysle PearlRobertson, Parish Dubose Talley 10/11/2017, 9:35 AM

## 2017-10-30 ENCOUNTER — Telehealth: Payer: Self-pay | Admitting: Emergency Medicine

## 2017-10-30 NOTE — Telephone Encounter (Signed)
Lost to followup 

## 2018-09-23 IMAGING — DX DG CHEST 2V
2 series · 2 of 2 positions shown · non-contrast
Comparison: 08/16/2014

CLINICAL DATA: Pt reports mid chest pain, sore throat, and
productive cough w/ yellow/green mucus x 4 days; reports fever on
day 1 but has since gone away; smoker

EXAM:
CHEST  2 VIEW

[w chest pa]
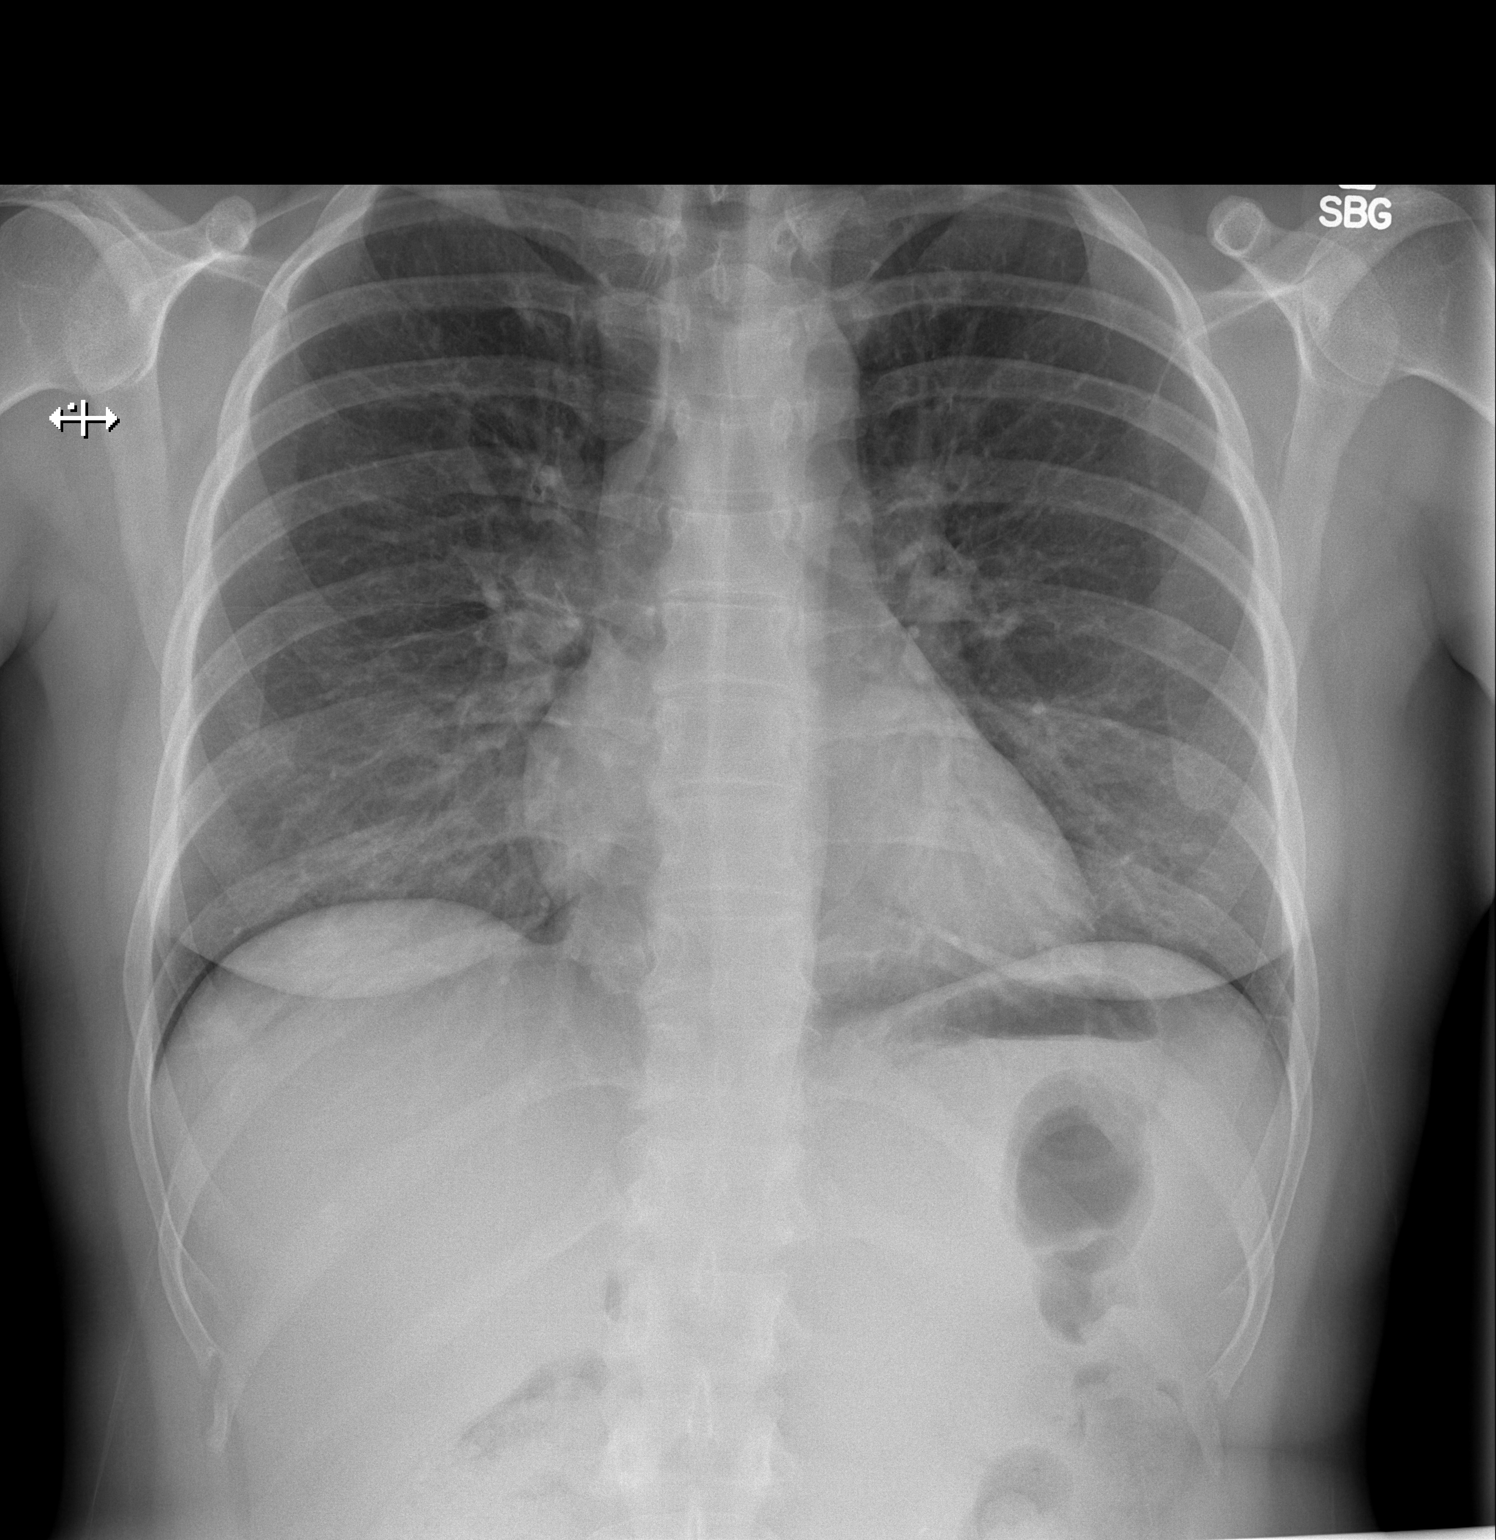

[w chest lat]
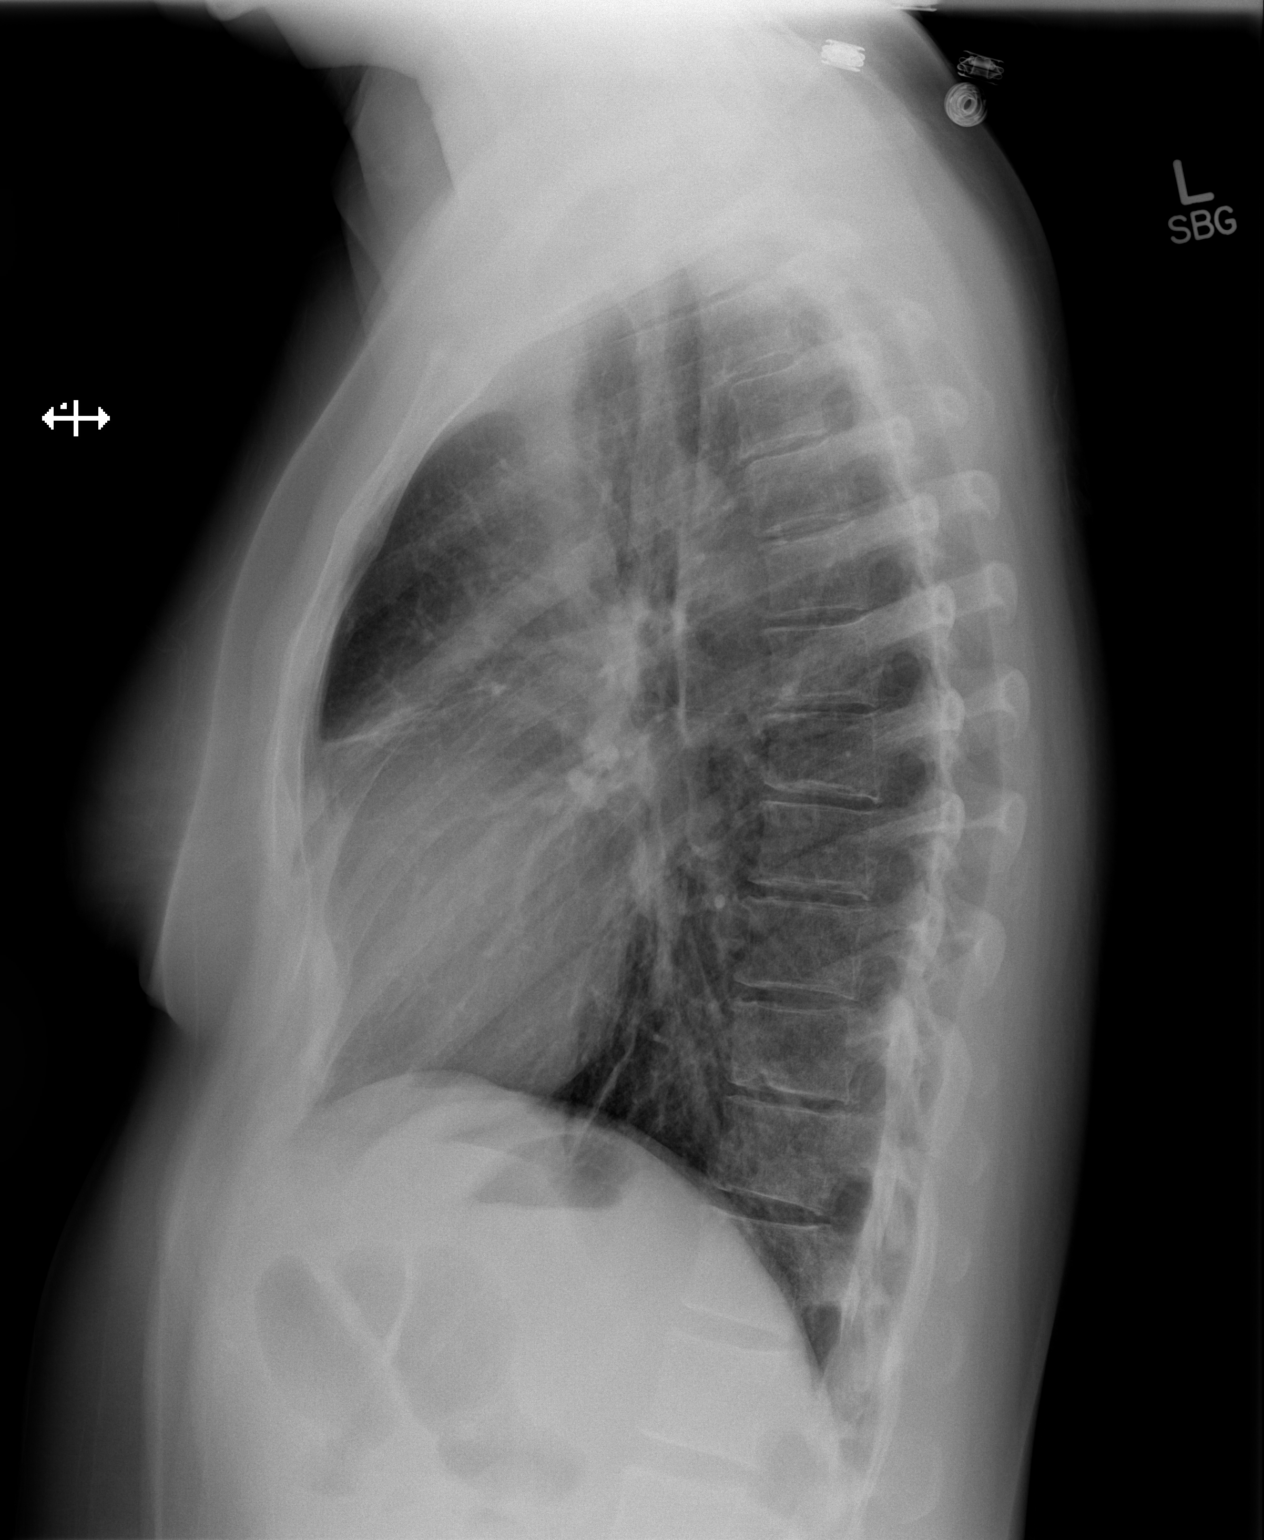

[2 of 2 positions shown; findings below may reference images not displayed]

FINDINGS: Cardiac silhouette is normal in size. Normal mediastinal and hilar
contours.

Mild linear opacities noted in the anterior mid lung on the lateral
view consistent with subsegmental atelectasis, new since the prior
study. Lungs are otherwise clear.

No pleural effusion or pneumothorax.

Skeletal structures are unremarkable.
IMPRESSION: No active cardiopulmonary disease.

## 2020-01-13 ENCOUNTER — Ambulatory Visit: Payer: Medicaid Other | Attending: Internal Medicine

## 2020-01-13 DIAGNOSIS — Z20822 Contact with and (suspected) exposure to covid-19: Secondary | ICD-10-CM

## 2020-01-14 LAB — NOVEL CORONAVIRUS, NAA: SARS-CoV-2, NAA: NOT DETECTED

## 2020-01-15 ENCOUNTER — Telehealth: Payer: Self-pay | Admitting: *Deleted

## 2020-01-15 NOTE — Telephone Encounter (Signed)
Patient called given negative covid results .
